# Patient Record
Sex: Female | Born: 1960
Health system: Southern US, Community
[De-identification: ages and names within clinical notes are randomized; demographics above are authoritative.]

## PROBLEM LIST (undated history)

## (undated) DIAGNOSIS — R079 Chest pain, unspecified: Secondary | ICD-10-CM

## (undated) DIAGNOSIS — M1990 Unspecified osteoarthritis, unspecified site: Secondary | ICD-10-CM

## (undated) DIAGNOSIS — I10 Essential (primary) hypertension: Secondary | ICD-10-CM

## (undated) DIAGNOSIS — E039 Hypothyroidism, unspecified: Secondary | ICD-10-CM

## (undated) DIAGNOSIS — R3915 Urgency of urination: Secondary | ICD-10-CM

## (undated) DIAGNOSIS — E119 Type 2 diabetes mellitus without complications: Secondary | ICD-10-CM

## (undated) DIAGNOSIS — E042 Nontoxic multinodular goiter: Secondary | ICD-10-CM

## (undated) DIAGNOSIS — D649 Anemia, unspecified: Secondary | ICD-10-CM

## (undated) DIAGNOSIS — K219 Gastro-esophageal reflux disease without esophagitis: Secondary | ICD-10-CM

## (undated) HISTORY — DX: Type 2 diabetes mellitus without complications: E11.9

## (undated) HISTORY — DX: Nontoxic multinodular goiter: E04.2

## (undated) HISTORY — PX: CHOLECYSTECTOMY: SHX55

## (undated) HISTORY — DX: Chest pain, unspecified: R07.9

## (undated) HISTORY — DX: Anemia, unspecified: D64.9

## (undated) HISTORY — PX: TUBAL LIGATION: SHX77

## (undated) HISTORY — PX: CARDIAC CATHETERIZATION: SHX172

## (undated) HISTORY — PX: KNEE SURGERY: SHX244

## (undated) HISTORY — DX: Gastro-esophageal reflux disease without esophagitis: K21.9

## (undated) HISTORY — DX: Unspecified osteoarthritis, unspecified site: M19.90

## (undated) HISTORY — DX: Essential (primary) hypertension: I10

---

## 1992-06-13 HISTORY — PX: TUBAL LIGATION: SHX77

## 1998-10-28 ENCOUNTER — Encounter: Payer: Self-pay | Admitting: Family Medicine

## 1998-10-28 ENCOUNTER — Ambulatory Visit (HOSPITAL_COMMUNITY): Admission: RE | Admit: 1998-10-28 | Discharge: 1998-10-28 | Payer: Self-pay | Admitting: Family Medicine

## 1998-10-30 ENCOUNTER — Encounter (HOSPITAL_BASED_OUTPATIENT_CLINIC_OR_DEPARTMENT_OTHER): Payer: Self-pay | Admitting: General Surgery

## 1998-11-02 ENCOUNTER — Ambulatory Visit (HOSPITAL_COMMUNITY): Admission: RE | Admit: 1998-11-02 | Discharge: 1998-11-03 | Payer: Self-pay | Admitting: General Surgery

## 1998-11-02 ENCOUNTER — Encounter (HOSPITAL_BASED_OUTPATIENT_CLINIC_OR_DEPARTMENT_OTHER): Payer: Self-pay | Admitting: General Surgery

## 1999-07-29 ENCOUNTER — Encounter: Payer: Self-pay | Admitting: Family Medicine

## 1999-07-29 ENCOUNTER — Ambulatory Visit (HOSPITAL_COMMUNITY): Admission: RE | Admit: 1999-07-29 | Discharge: 1999-07-29 | Payer: Self-pay | Admitting: Family Medicine

## 1999-08-11 ENCOUNTER — Other Ambulatory Visit: Admission: RE | Admit: 1999-08-11 | Discharge: 1999-08-11 | Payer: Self-pay | Admitting: Obstetrics and Gynecology

## 2000-08-11 ENCOUNTER — Other Ambulatory Visit: Admission: RE | Admit: 2000-08-11 | Discharge: 2000-08-11 | Payer: Self-pay | Admitting: Obstetrics and Gynecology

## 2000-12-05 ENCOUNTER — Encounter: Payer: Self-pay | Admitting: *Deleted

## 2000-12-05 ENCOUNTER — Emergency Department (HOSPITAL_COMMUNITY): Admission: EM | Admit: 2000-12-05 | Discharge: 2000-12-05 | Payer: Self-pay | Admitting: *Deleted

## 2001-01-26 ENCOUNTER — Encounter: Payer: Self-pay | Admitting: Endocrinology

## 2001-01-26 ENCOUNTER — Ambulatory Visit (HOSPITAL_COMMUNITY): Admission: RE | Admit: 2001-01-26 | Discharge: 2001-01-26 | Payer: Self-pay | Admitting: Endocrinology

## 2001-07-06 ENCOUNTER — Encounter: Payer: Self-pay | Admitting: Endocrinology

## 2001-07-06 ENCOUNTER — Ambulatory Visit (HOSPITAL_COMMUNITY): Admission: RE | Admit: 2001-07-06 | Discharge: 2001-07-06 | Payer: Self-pay | Admitting: Endocrinology

## 2001-08-29 ENCOUNTER — Other Ambulatory Visit: Admission: RE | Admit: 2001-08-29 | Discharge: 2001-08-29 | Payer: Self-pay | Admitting: Obstetrics and Gynecology

## 2002-09-26 ENCOUNTER — Other Ambulatory Visit: Admission: RE | Admit: 2002-09-26 | Discharge: 2002-09-26 | Payer: Self-pay | Admitting: Obstetrics and Gynecology

## 2003-04-02 ENCOUNTER — Ambulatory Visit (HOSPITAL_COMMUNITY): Admission: RE | Admit: 2003-04-02 | Discharge: 2003-04-02 | Payer: Self-pay | Admitting: Endocrinology

## 2003-04-02 ENCOUNTER — Encounter: Payer: Self-pay | Admitting: Endocrinology

## 2003-09-30 ENCOUNTER — Other Ambulatory Visit: Admission: RE | Admit: 2003-09-30 | Discharge: 2003-09-30 | Payer: Self-pay | Admitting: Obstetrics and Gynecology

## 2004-10-01 ENCOUNTER — Other Ambulatory Visit: Admission: RE | Admit: 2004-10-01 | Discharge: 2004-10-01 | Payer: Self-pay | Admitting: Obstetrics and Gynecology

## 2005-08-10 ENCOUNTER — Ambulatory Visit (HOSPITAL_COMMUNITY): Admission: RE | Admit: 2005-08-10 | Discharge: 2005-08-10 | Payer: Self-pay | Admitting: Endocrinology

## 2005-11-19 ENCOUNTER — Emergency Department (HOSPITAL_COMMUNITY): Admission: EM | Admit: 2005-11-19 | Discharge: 2005-11-19 | Payer: Self-pay | Admitting: Family Medicine

## 2006-06-13 HISTORY — PX: CARDIAC CATHETERIZATION: SHX172

## 2006-08-14 ENCOUNTER — Ambulatory Visit (HOSPITAL_COMMUNITY): Admission: RE | Admit: 2006-08-14 | Discharge: 2006-08-14 | Payer: Self-pay | Admitting: Family Medicine

## 2006-09-13 ENCOUNTER — Encounter: Admission: RE | Admit: 2006-09-13 | Discharge: 2006-12-12 | Payer: Self-pay | Admitting: Family Medicine

## 2007-05-22 ENCOUNTER — Observation Stay (HOSPITAL_COMMUNITY): Admission: EM | Admit: 2007-05-22 | Discharge: 2007-05-24 | Payer: Self-pay | Admitting: Emergency Medicine

## 2007-05-23 ENCOUNTER — Encounter (INDEPENDENT_AMBULATORY_CARE_PROVIDER_SITE_OTHER): Payer: Self-pay | Admitting: *Deleted

## 2007-08-14 ENCOUNTER — Ambulatory Visit (HOSPITAL_COMMUNITY): Admission: RE | Admit: 2007-08-14 | Discharge: 2007-08-14 | Payer: Self-pay | Admitting: Endocrinology

## 2008-04-23 ENCOUNTER — Encounter: Admission: RE | Admit: 2008-04-23 | Discharge: 2008-04-23 | Payer: Self-pay | Admitting: Family Medicine

## 2008-08-18 ENCOUNTER — Ambulatory Visit (HOSPITAL_COMMUNITY): Admission: RE | Admit: 2008-08-18 | Discharge: 2008-08-18 | Payer: Self-pay | Admitting: Endocrinology

## 2008-11-14 IMAGING — CT CT ANGIO CHEST
2 of 5 series · 17 of 36 positions shown · IV contrast (APPLIED)
Comparison: none

CLINICAL DATA: Chest pain, question pulmonary embolus. 
 CT ANGIOGRAPHY OF CHEST:
TECHNIQUE: Multidetector CT imaging of the chest was performed during bolus injection of intravenous contrast.  Multiplanar CT angiographic image reconstructions were generated to evaluate the vascular anatomy.
 Contrast:  100 cc Omnipaque 300.

[Series 5: pulm embolism 2.0 b31f st · axial · 0.61mm/px · z∈[-132,+72]mm · 14 of 118 slices shown]
[im 8/118  lung]
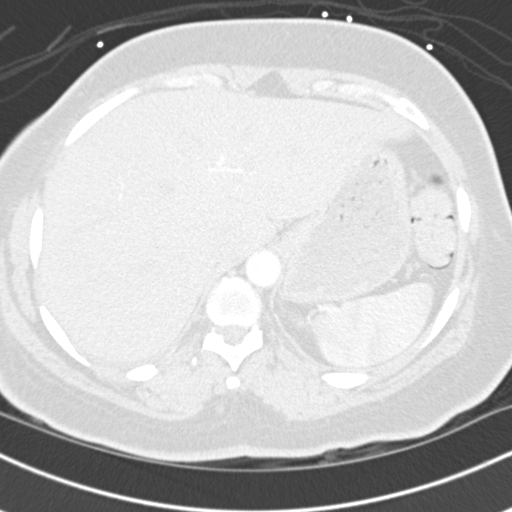
[im 16/118  mediastinal]
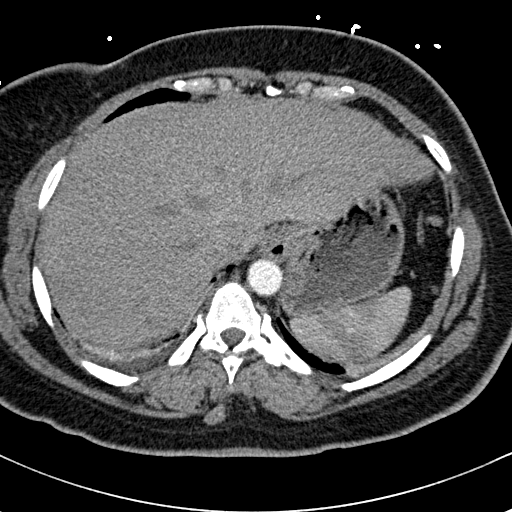
[im 24/118  lung]
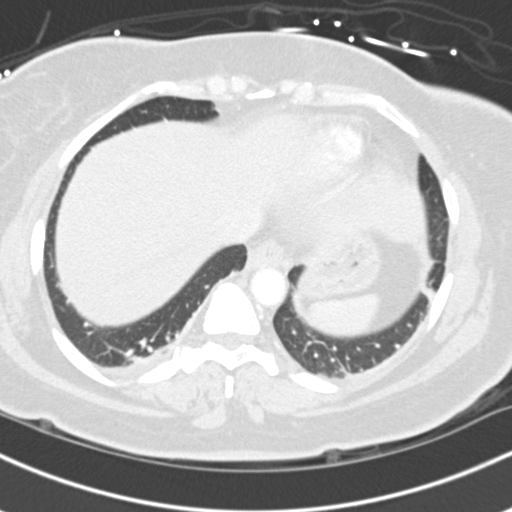
[im 32/118  mediastinal]
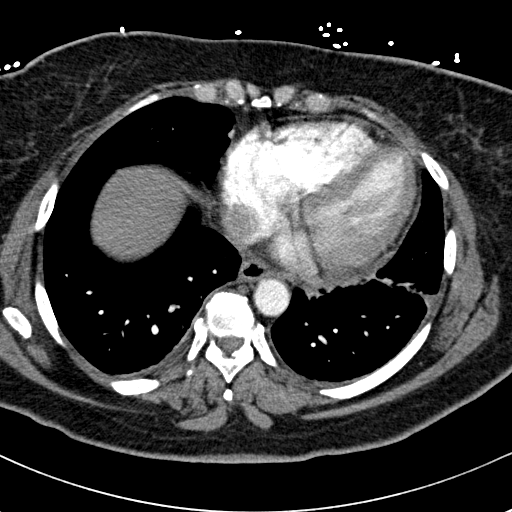
[im 40/118  lung]
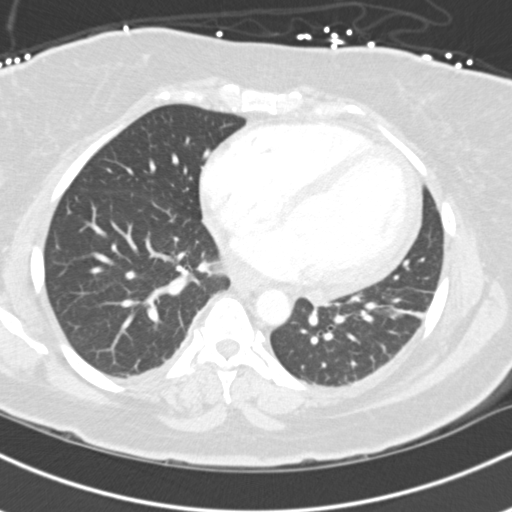
[im 47/118  mediastinal]
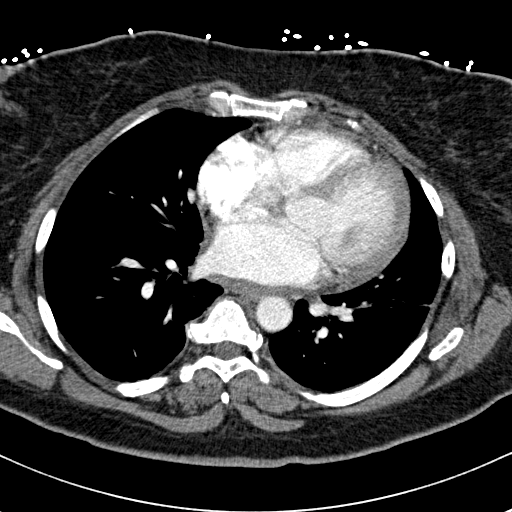
[im 55/118  lung]
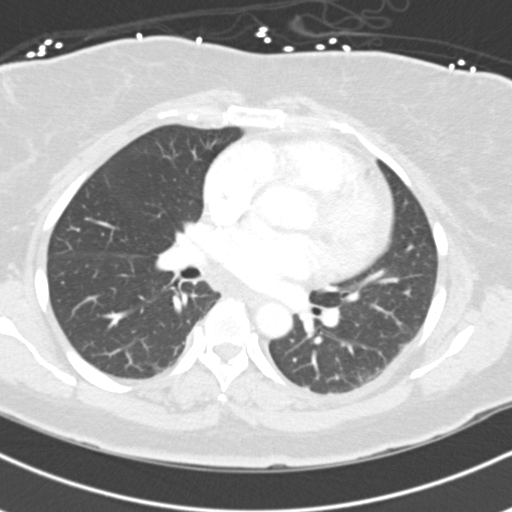
[im 63/118  mediastinal]
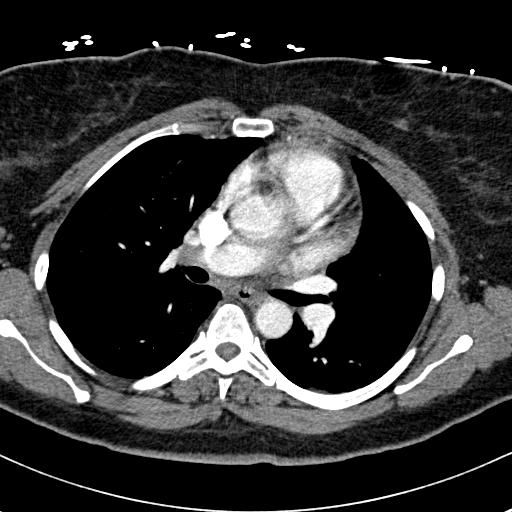
[im 71/118  lung]
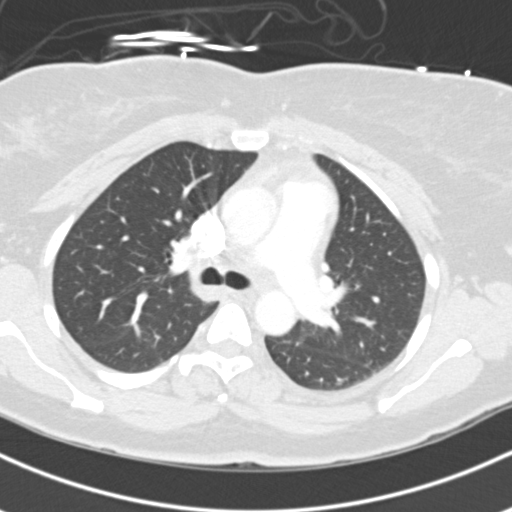
[im 79/118  mediastinal]
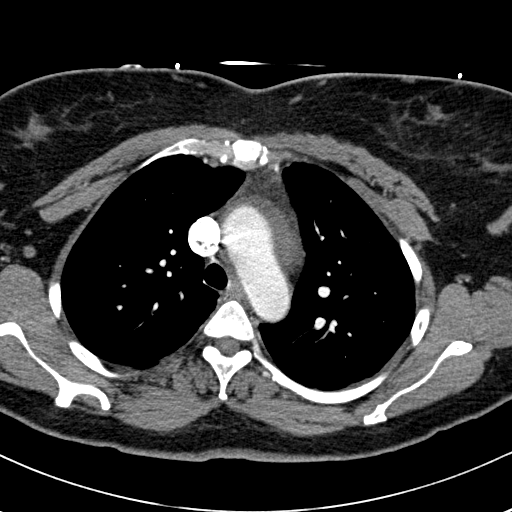
[im 86/118  lung]
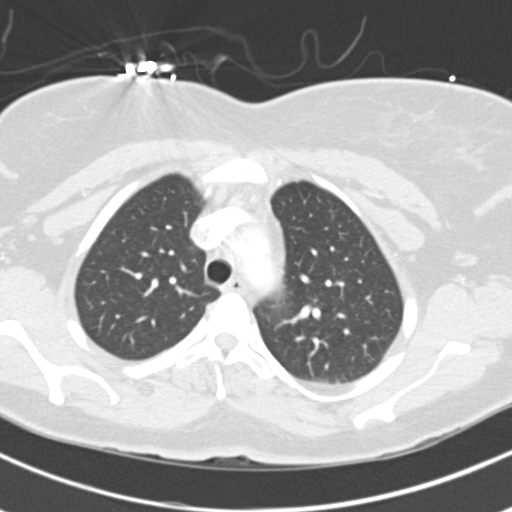
[im 94/118  mediastinal]
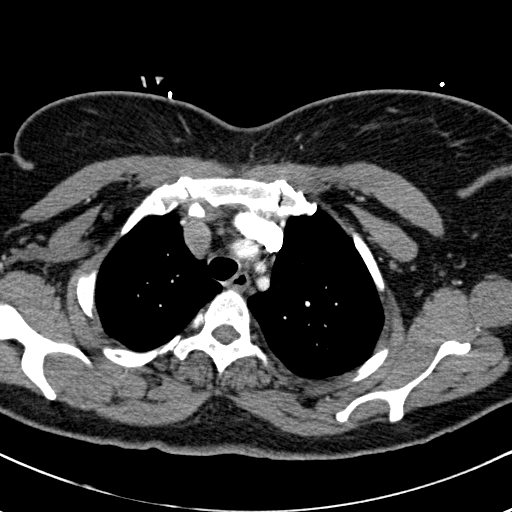
[im 102/118  lung]
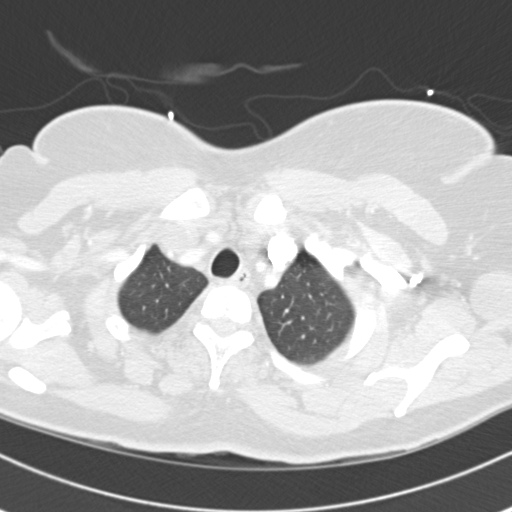
[im 110/118  mediastinal]
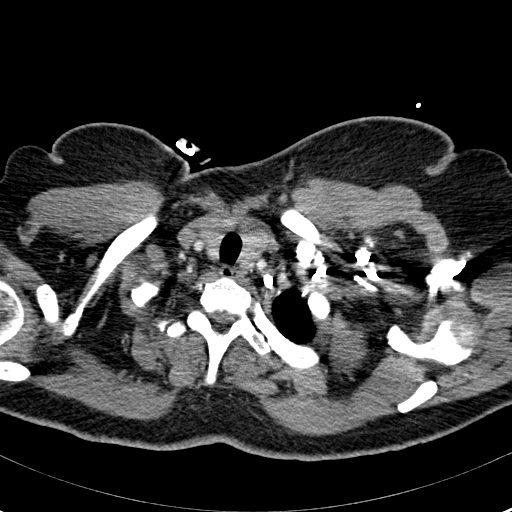

[Series 9: pulm embolism 2.0 spo cor st · coronal · 0.61mm/px · 3 of 116 slices shown]
[im 24/116  mediastinal]
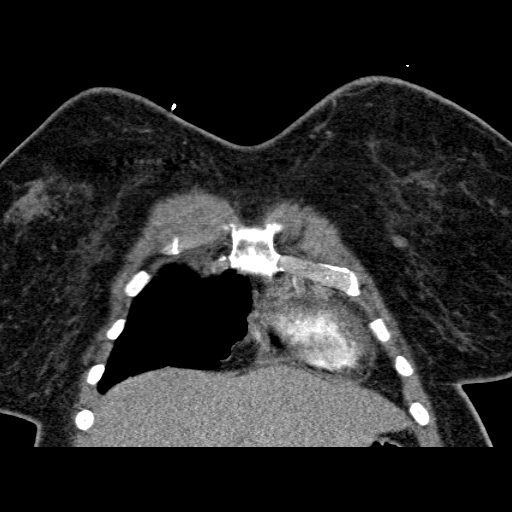
[im 47/116  mediastinal]
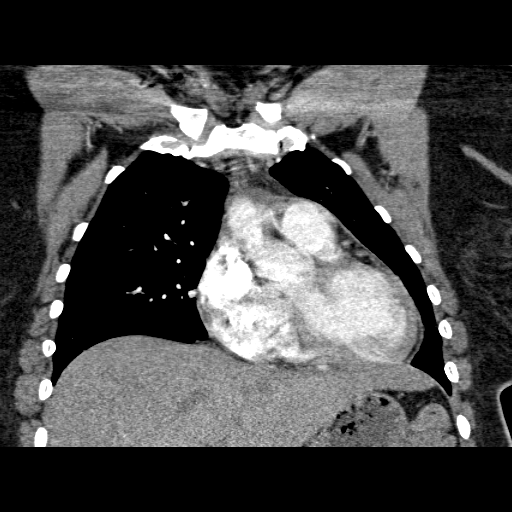
[im 70/116  mediastinal]
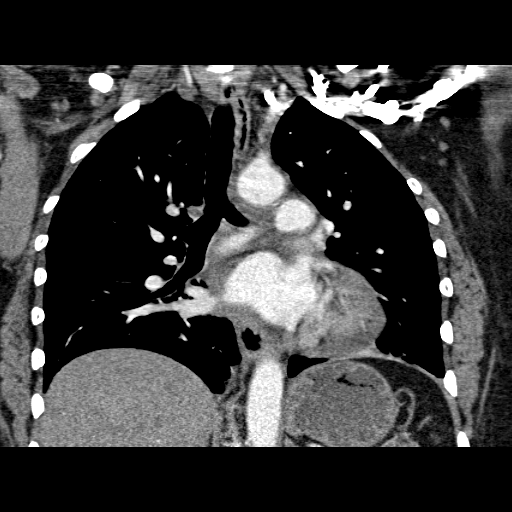

[17 of 36 positions shown; findings below may reference images not displayed]

FINDINGS: There is no CT scan evidence for pulmonary emboli.  Lung window settings demonstrate linear atelectatic changes within the lower lobes.  There is no mediastinal or hilar adenopathy.  The central airways are patent.  There are no pulmonary nodules.  
 Bone window settings demonstrate no osseous lesions.
IMPRESSION: 1.  No CT scan evidence for pulmonary emboli.  
 2.  Linear bibasilar atelectatic changes.

## 2009-06-30 ENCOUNTER — Ambulatory Visit: Payer: Self-pay | Admitting: Hematology and Oncology

## 2009-07-01 LAB — URINALYSIS, MICROSCOPIC - CHCC
Bilirubin (Urine): NEGATIVE
Glucose: NEGATIVE g/dL
Nitrite: NEGATIVE

## 2009-07-01 LAB — CBC & DIFF AND RETIC
Basophils Absolute: 0 10*3/uL (ref 0.0–0.1)
Eosinophils Absolute: 0.1 10*3/uL (ref 0.0–0.5)
HCT: 37.5 % (ref 34.8–46.6)
HGB: 12.2 g/dL (ref 11.6–15.9)
LYMPH%: 24.4 % (ref 14.0–49.7)
MONO#: 0.4 10*3/uL (ref 0.1–0.9)
NEUT#: 3.7 10*3/uL (ref 1.5–6.5)
NEUT%: 65.8 % (ref 38.4–76.8)
Platelets: 355 10*3/uL (ref 145–400)
RBC: 4.73 10*6/uL (ref 3.70–5.45)
Retic %: 0.96 % (ref 0.50–1.50)
WBC: 5.6 10*3/uL (ref 3.9–10.3)

## 2009-07-03 LAB — VON WILLEBRAND PANEL
Ristocetin-Cofactor: 115 % (ref 50–150)
Von Willebrand Ag: 166 % normal — ABNORMAL HIGH (ref 61–164)

## 2009-07-03 LAB — PROTEIN ELECTROPHORESIS, SERUM, WITH REFLEX
Albumin ELP: 52 % — ABNORMAL LOW (ref 55.8–66.1)
Alpha-2-Globulin: 11.3 % (ref 7.1–11.8)
Beta Globulin: 6.6 % (ref 4.7–7.2)
Total Protein, Serum Electrophoresis: 7.8 g/dL (ref 6.0–8.3)

## 2009-07-03 LAB — COMPREHENSIVE METABOLIC PANEL
Albumin: 4.5 g/dL (ref 3.5–5.2)
Alkaline Phosphatase: 69 U/L (ref 39–117)
Chloride: 100 mEq/L (ref 96–112)
Glucose, Bld: 104 mg/dL — ABNORMAL HIGH (ref 70–99)
Potassium: 3.7 mEq/L (ref 3.5–5.3)
Sodium: 138 mEq/L (ref 135–145)
Total Protein: 7.8 g/dL (ref 6.0–8.3)

## 2009-07-03 LAB — FERRITIN: Ferritin: 31 ng/mL (ref 10–291)

## 2009-07-03 LAB — HEMOGLOBINOPATHY EVALUATION
Hemoglobin Other: 0 % (ref 0.0–0.0)
Hgb A2 Quant: 2.6 % (ref 2.2–3.2)
Hgb A: 97.4 % (ref 96.8–97.8)

## 2009-07-03 LAB — FOLATE: Folate: 18.8 ng/mL

## 2009-07-03 LAB — IRON AND TIBC: Iron: 53 ug/dL (ref 42–145)

## 2009-07-06 LAB — FECAL OCCULT BLOOD, GUAIAC: Occult Blood: NEGATIVE

## 2009-10-22 ENCOUNTER — Ambulatory Visit (HOSPITAL_COMMUNITY): Admission: RE | Admit: 2009-10-22 | Discharge: 2009-10-22 | Payer: Self-pay | Admitting: Endocrinology

## 2010-07-02 ENCOUNTER — Other Ambulatory Visit (HOSPITAL_COMMUNITY): Payer: Self-pay | Admitting: Endocrinology

## 2010-07-02 DIAGNOSIS — E042 Nontoxic multinodular goiter: Secondary | ICD-10-CM

## 2010-10-25 ENCOUNTER — Ambulatory Visit
Admission: RE | Admit: 2010-10-25 | Discharge: 2010-10-25 | Disposition: A | Discharge status: Home / Self Care | Payer: 59 | Source: Ambulatory Visit | Attending: Endocrinology | Admitting: Endocrinology

## 2010-10-25 DIAGNOSIS — E042 Nontoxic multinodular goiter: Secondary | ICD-10-CM

## 2010-10-26 NOTE — H&P (Signed)
NAMERENELDA, Meghan Morales              ACCOUNT NO.:  [id number]   MEDICAL RECORD NO.:  [id number]          PATIENT TYPE:  INP   LOCATION:  2807                         FACILITY:  [facility]   PHYSICIAN:  Darlin Priestly, MD  DATE OF BIRTH:  [birth year]   DATE OF ADMISSION:  05/22/2007  DATE OF DISCHARGE:                              HISTORY & PHYSICAL   Ms. Meghan Morales is a 50 year old married African-American female patient who  comes to the emergency room by EMS and diagnosed as an ST elevation MI  with possible inferior ST elevation inferiorly.  She states that  yesterday between 2-3 p.m., she started having chest pain with shortness  of breath.  No nausea or vomiting.  She was hot and diaphoretic.  Today,  she went to see her primary care physician, Dr. Renaye Rakers.  An EKG was  done, and she was sent to the ER by EMS.   PAST MEDICAL HISTORY:  1. Hypertension x17 years.  2. NIDDM x2 years.  3. Hypothyroidism.  4. She has had a cholecystectomy and a tubal ligation.   MEDICATIONS:  Metformin, Synthroid, Micardis, aspirin 81 mg daily.  Other doses of medicines not known.   ALLERGIES:  NKDA.   FAMILY HISTORY:  Mother is still alive.  She has hypertension.  Father  is alive.  He has some CHF.  She has one brother.  Both grandparents are  without any coronary artery disease.   SOCIAL HISTORY:  She does not smoke.  She does not drink any alcohol.  She is married and has two children.  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  It is a stressful work.  She lives in  [place].   REVIEW OF SYSTEMS:  She denies any syncope, presyncope.  No GERD.  No  black, tarry stools.  Positive chest pain.  No abdominal pain.  No lower  extremity edema.  No cough, no cold, no fever, no chills.  No dysuria.  No recent falls, trauma, or surgeries.  Other systems are negative.   PHYSICAL EXAMINATION:  Blood pressure is 137/83, heart rate 84.  GENERAL:  She is having mild pain.  She is tearful and  talking on the  phone when we entered the room.  HEENT:  Pupils are equal and round.  She has no carotid bruits, no thyromegaly, no adenopathy.  Respirations are clear to auscultation bilaterally.  CARDIOVASCULAR:  Heart sounds are regular.  S1 and S2 are present.  No  murmur or gallop is noted.  GI:  Bowel sounds are present x4.  Positive obesity.  No tenderness.  NEURO:  Alert and oriented x3.  She has no focal abnormality.  MUSCULOSKELETAL:  Moves all extremities x4.  LOWER EXTREMITIES:  No edema.   ASSESSMENT:  1. Chest pain.  2. Abnormal electrocardiogram with possible inferior ST elevation and      possible ST elevation myocardial infarction.  3. Hypertension.  4. Non-insulin-dependent diabetes mellitus.  5. Hypothyroidism.   PLAN:  She was seen by Dr. Lenise Herald in the ER.  Risks and benefits  of emergent cath  described and PCI all explained, including death, MI,  CVA, or need for urgent open heart surgery, and possible chronic  complications.  The patient was taken to the cath lab.      Lezlie Octave, N.P.      Darlin Priestly, MD  Electronically Signed    BB/MEDQ  D:  05/22/2007  T:  05/22/2007  Job:  [id number]   cc:   Renaye Rakers, M.D.

## 2010-10-26 NOTE — Cardiovascular Report (Signed)
Meghan Morales, Meghan Morales              ACCOUNT NO.:  [id number]   MEDICAL RECORD NO.:  [id number]          PATIENT TYPE:  INP   LOCATION:  2807                         FACILITY:  [facility]   PHYSICIAN:  Darlin Priestly, MD  DATE OF BIRTH:  05/09/1961   DATE OF PROCEDURE:  05/22/2007  DATE OF DISCHARGE:                            CARDIAC CATHETERIZATION   PROCEDURE:  1. Left heart catheterization.  2. Coronary angiography.  3. Left ventriculogram.  4. Abdominal aortogram.   ATTENDING:  Darlin Priestly, MD   COMPLICATIONS:  None.   INDICATIONS:  [name] is a 50 year old female patient of Dr. Renaye Rakers and Dr. Caprice Kluver, with a history of hypertension and diabetes,  was seen by Dr. Renaye Rakers with a complaint of 24 hours of substernal  chest pain without associated radiation.  In Dr. Tedra Senegal office, she was  noted have mild inferolateral ST elevation.  She was subsequently  transferred to [facility], where a code STEMI was activated.  She did appear to have mild inferolateral ST elevation.  She is now  brought for cardiac catheterization to rule out significant CAD.   DESCRIPTION OF OPERATION:  After giving informed written consent, the  patient was brought to the cardiac cath lab.  Right groin was shaved,  prepped and draped in sterile fashion.  EKG monitoring was established.  Using a modified Seldinger technique, a #6-French arterial sheath was  inserted in the right femoral artery.  The 6-French diagnostic catheter  was used to perform diagnostic angiography.   The left main is a medium to large vessel with no significant disease.   The LAD is a large vessel coursing to the apex and gives rise to 2  diagonal branches.  The LAD has no significant disease.   The first and second diagonals are small- to medium-size vessels with no  disease.   The left circumflex is a medium-size vessel coursing to the AV groove  and gives rise to 2 obtuse marginal branches.   The AV groove circumflex  has no significant disease.   The first OM is a medium-sized vessel which bifurcates distally with no  significant disease.   The second OM is a small vessel with no significant disease.   The right coronary is a medium-sized vessel which is dominant and give  off both PDA as well as a posterolateral branch.  There is no  significant disease in the RCA, PDA or posterolateral branch.   Left ventriculogram revealed a preserved EF of 60%.  There is a  significant amount of ectopy noted with MR, however, suggests this is  all secondary to ectopy.   Abdominal aortogram reveals no evidence of renal artery stenosis or  aneurysm.   CONCLUSION:  1. No significant coronary artery disease.  2. Normal left ventricular systolic function.  3. No evidence of renal artery stenosis.      Darlin Priestly, MD  Electronically Signed     RHM/MEDQ  D:  05/22/2007  T:  05/23/2007  Job:  [id number]   cc:   Thereasa Solo. Little, M.D.  Adrian Saran  Parke Simmers, M.D.

## 2010-10-26 NOTE — Discharge Summary (Signed)
Meghan Morales, Meghan Morales              ACCOUNT NO.:  [id number]   MEDICAL RECORD NO.:  [id number]          PATIENT TYPE:  INP   LOCATION:  2006                         FACILITY:  [facility]   PHYSICIAN:  Raymon Mutton, P.A. DATE OF BIRTH:  Oct 11, 1960   DATE OF ADMISSION:  05/22/2007  DATE OF DISCHARGE:  05/24/2007                               DISCHARGE SUMMARY   DISCHARGE DIAGNOSES:  1. Chest pain resolved, etiology undetermined, status post cath with      normal coronaries.  2. Hypertension.  3. Hypothyroidism,  4. Non-insulin-dependent diabetes mellitus.   HISTORY:  This is 50 year old female who presented to the hospital with  complaints of chest pain and to the ER was diagnosed with ST elevation  MI with inferior ST elevations with shortness of breath, feeling hot and  diaphoretic. Immediately the same day the patient underwent coronary  angiography that revealed no significant coronary artery disease, normal  left ventricular systolic function and her renal arteries did not reveal  any signs of stenosis.   The patient tolerated the cath and was transferred to the unit in stable  condition.  We cycled her cardiac enzymes that did not show any  abnormality. Her lipid profile was normal too, and she was mildly  anemic. And her D-dimer was elevated to 2.  A chest CT angio was ordered  and this did not reveal any signs of pulmonary embolism.   The next morning the patient was seen by Dr. Allyson Sabal and was considered to  be stable for discharge home.  Her hospital laboratories showed  hemoglobin of  10.8, hematocrit 31.5, white blood cell count 6.5,  platelet count 297. Sodium 139, potassium 3.4, BUN 8, creatinine 0.74,  chloride 107, CO2 of 25, glucose 89, iron was low at 31, iron-binding  capacity normal, and UIBC 225.  Ferritin is 111. Normal TSH 0.695.  Vitamin B12 of 533, magnesium 1.9. Total cholesterol 110, triglycerides  36, HDL 33, LDL 70.   DISCHARGE MEDICATIONS:  Aspirin 81  mg daily, Protonix 40 mg daily,  Micardis __________ 25 mg daily, metformin 500 mg, the patient was  allowed to resume on Saturday. Synthroid 150 mcg daily.   DISCHARGE FOLLOWUP:  Dr. Jenne Campus will see patient on June 17, 2006 at  9:45 a.m. in our office.      Raymon Mutton, P.A.     MK/MEDQ  D:  05/24/2007  T:  05/24/2007  Job:  [id number]   cc:   Darlin Priestly, MD  Renaye Rakers, M.D.

## 2010-10-27 ENCOUNTER — Other Ambulatory Visit (HOSPITAL_COMMUNITY): Payer: Self-pay

## 2010-12-03 ENCOUNTER — Other Ambulatory Visit: Payer: Self-pay | Admitting: Obstetrics and Gynecology

## 2011-03-21 LAB — I-STAT EC8
Acid-Base Excess: 1
Acid-base deficit: 3 — ABNORMAL HIGH
BUN: 11
Chloride: 103
Chloride: 108
Glucose, Bld: 106 — ABNORMAL HIGH
HCT: 34 — ABNORMAL LOW
Operator id: 274862
Potassium: 3.2 — ABNORMAL LOW
Sodium: 138
pCO2 arterial: 30.9 — ABNORMAL LOW
pH, Arterial: 7.426 — ABNORMAL HIGH

## 2011-03-21 LAB — CARDIAC PANEL(CRET KIN+CKTOT+MB+TROPI)
CK, MB: 0.9
Relative Index: INVALID
Total CK: 88

## 2011-03-21 LAB — CBC
HCT: 31.5 — ABNORMAL LOW
Hemoglobin: 10.1 — ABNORMAL LOW
Hemoglobin: 9.6 — ABNORMAL LOW
MCHC: 34.3
MCV: 78.3
Platelets: 297
RBC: 3.64 — ABNORMAL LOW
RBC: 3.82 — ABNORMAL LOW
RDW: 13.4
RDW: 13.5
WBC: 6.5

## 2011-03-21 LAB — LIPID PANEL
Cholesterol: 110
Cholesterol: 112
Total CHOL/HDL Ratio: 3.6

## 2011-03-21 LAB — BASIC METABOLIC PANEL
BUN: 8
CO2: 24
CO2: 25
Chloride: 107
Creatinine, Ser: 0.74
GFR calc non Af Amer: >60
Glucose, Bld: 108 — ABNORMAL HIGH
Glucose, Bld: 89
Potassium: 3 — ABNORMAL LOW
Potassium: 3.4 — ABNORMAL LOW
Sodium: 137

## 2011-03-21 LAB — DIFFERENTIAL
Basophils Relative: 1
Eosinophils Absolute: 0.1 — ABNORMAL LOW
Monocytes Relative: 10
Neutrophils Relative %: 70

## 2011-03-21 LAB — IRON AND TIBC: UIBC: 225

## 2011-03-21 LAB — MAGNESIUM
Magnesium: 1.9
Magnesium: 2.1

## 2011-03-21 LAB — VITAMIN B12: Vitamin B-12: 533 (ref 211–911)

## 2011-03-21 LAB — COMPREHENSIVE METABOLIC PANEL
ALT: 24
Alkaline Phosphatase: 52
CO2: 25
GFR calc non Af Amer: >60
Glucose, Bld: 117 — ABNORMAL HIGH
Potassium: 3.1 — ABNORMAL LOW
Sodium: 133 — ABNORMAL LOW

## 2011-03-21 LAB — HEMOGLOBIN A1C
Hgb A1c MFr Bld: 5.7
Mean Plasma Glucose: 126

## 2011-03-21 LAB — PROTIME-INR
INR: 1
INR: 1.2
Prothrombin Time: 15

## 2011-03-21 LAB — RETICULOCYTES: Retic Count, Absolute: 31.8

## 2011-03-21 LAB — POCT I-STAT CREATININE: Creatinine, Ser: 0.5

## 2011-03-21 LAB — FOLATE RBC: RBC Folate: 676 — ABNORMAL HIGH

## 2011-03-21 LAB — APTT: aPTT: 25

## 2012-08-06 ENCOUNTER — Other Ambulatory Visit: Payer: Self-pay | Admitting: Endocrinology

## 2012-08-06 DIAGNOSIS — E049 Nontoxic goiter, unspecified: Secondary | ICD-10-CM

## 2012-10-22 ENCOUNTER — Other Ambulatory Visit: Payer: 59

## 2012-10-26 ENCOUNTER — Ambulatory Visit
Admission: RE | Admit: 2012-10-26 | Discharge: 2012-10-26 | Disposition: A | Discharge status: Home / Self Care | Payer: 59 | Source: Ambulatory Visit | Attending: Endocrinology | Admitting: Endocrinology

## 2012-10-26 DIAGNOSIS — E049 Nontoxic goiter, unspecified: Secondary | ICD-10-CM

## 2013-01-03 ENCOUNTER — Other Ambulatory Visit: Payer: Self-pay | Admitting: Obstetrics and Gynecology

## 2013-05-08 ENCOUNTER — Ambulatory Visit (INDEPENDENT_AMBULATORY_CARE_PROVIDER_SITE_OTHER): Payer: 59

## 2013-05-08 ENCOUNTER — Encounter: Payer: Self-pay | Admitting: Podiatry

## 2013-05-08 ENCOUNTER — Ambulatory Visit (INDEPENDENT_AMBULATORY_CARE_PROVIDER_SITE_OTHER): Payer: 59 | Admitting: Podiatry

## 2013-05-08 VITALS — BP 111/66 | HR 74 | Resp 12

## 2013-05-08 DIAGNOSIS — R52 Pain, unspecified: Secondary | ICD-10-CM

## 2013-05-08 DIAGNOSIS — M722 Plantar fascial fibromatosis: Secondary | ICD-10-CM

## 2013-05-08 MED ORDER — TRIAMCINOLONE ACETONIDE 10 MG/ML IJ SUSP
10.0000 mg | Freq: Once | INTRAMUSCULAR | Status: AC
  Administered 2013-05-08: 10 mg

## 2013-05-08 MED ORDER — DICLOFENAC SODIUM 75 MG PO TBEC: 75.0000 mg | DELAYED_RELEASE_TABLET | Freq: Two times a day (BID) | ORAL | Status: DC

## 2013-05-08 NOTE — Progress Notes (Signed)
N-SORE L-LT HEEL D-1 MONTH O-SLOWLY C-BETTER A-PRESSURE T-N /A

## 2013-05-08 NOTE — Progress Notes (Signed)
Subjective:     Patient ID: Meghan Morales, female   DOB: 10-Nov-1960, 52 y.o.   MRN: [id number]  HPI patient states I'm having pain in my left heel of several months duration. States she's had this in the past that it's been at least 5 years since her last episode and she is on her feet quite a bit at work. States her sugar has been under good control and she was sent over by Dr. Parke Simmers   Review of Systems  All other systems reviewed and are negative.       Objective:   Physical Exam  Nursing note and vitals reviewed. Constitutional: She is oriented to person, place, and time.  Cardiovascular: Intact distal pulses.   Musculoskeletal: Normal range of motion.  Neurological: She is oriented to person, place, and time.  Skin: Skin is warm.   neurovascular status is intact with mild equinus noted and flatfoot deformity noted of both feet. Pain in the left plantar fascia at the insertional point of the tendon into the calcaneus with mild to moderate fluid buildup noted    Assessment:     Plantar fasciitis left with some depression of the arch noted    Plan:     H&P and x-rays reviewed with patient. Today I injected the left plantar fascia 3 mg Kenalog 5 of Xylocaine Marcaine mixture and applied fascially brace with instructions on usage. Placed on Voltaren 75 mg twice a day and reappoint in 2 weeks

## 2013-05-08 NOTE — Patient Instructions (Signed)
Plantar Fasciitis (Heel Spur Syndrome)  with Rehab  The plantar fascia is a fibrous, ligament-like, soft-tissue structure that spans the bottom of the foot. Plantar fasciitis is a condition that causes pain in the foot due to inflammation of the tissue.  SYMPTOMS   · Pain and tenderness on the underneath side of the foot.  · Pain that worsens with standing or walking.  CAUSES   Plantar fasciitis is caused by irritation and injury to the plantar fascia on the underneath side of the foot. Common mechanisms of injury include:  · Direct trauma to bottom of the foot.  · Damage to a small nerve that runs under the foot where the main fascia attaches to the heel bone.  · Stress placed on the plantar fascia due to bone spurs.  RISK INCREASES WITH:   · Activities that place stress on the plantar fascia (running, jumping, pivoting, or cutting).  · Poor strength and flexibility.  · Improperly fitted shoes.  · Tight calf muscles.  · Flat feet.  · Failure to warm-up properly before activity.  · Obesity.  PREVENTION  · Warm up and stretch properly before activity.  · Allow for adequate recovery between workouts.  · Maintain physical fitness:  · Strength, flexibility, and endurance.  · Cardiovascular fitness.  · Maintain a health body weight.  · Avoid stress on the plantar fascia.  · Wear properly fitted shoes, including arch supports for individuals who have flat feet.  PROGNOSIS   If treated properly, then the symptoms of plantar fasciitis usually resolve without surgery. However, occasionally surgery is necessary.  RELATED COMPLICATIONS   · Recurrent symptoms that may result in a chronic condition.  · Problems of the lower back that are caused by compensating for the injury, such as limping.  · Pain or weakness of the foot during push-off following surgery.  · Chronic inflammation, scarring, and partial or complete fascia tear, occurring more often from repeated injections.  TREATMENT   Treatment initially involves the use of  ice and medication to help reduce pain and inflammation. The use of strengthening and stretching exercises may help reduce pain with activity, especially stretches of the Achilles tendon. These exercises may be performed at home or with a therapist. Your caregiver may recommend that you use heel cups of arch supports to help reduce stress on the plantar fascia. Occasionally, corticosteroid injections are given to reduce inflammation. If symptoms persist for greater than 6 months despite non-surgical (conservative), then surgery may be recommended.   MEDICATION   · If pain medication is necessary, then nonsteroidal anti-inflammatory medications, such as aspirin and ibuprofen, or other minor pain relievers, such as acetaminophen, are often recommended.  · Do not take pain medication within 7 days before surgery.  · Prescription pain relievers may be given if deemed necessary by your caregiver. Use only as directed and only as much as you need.  · Corticosteroid injections may be given by your caregiver. These injections should be reserved for the most serious cases, because they may only be given a certain number of times.  HEAT AND COLD  · Cold treatment (icing) relieves pain and reduces inflammation. Cold treatment should be applied for 10 to 15 minutes every 2 to 3 hours for inflammation and pain and immediately after any activity that aggravates your symptoms. Use ice packs or massage the area with a piece of ice (ice massage).  · Heat treatment may be used prior to performing the stretching and strengthening activities prescribed   by your caregiver, physical therapist, or athletic trainer. Use a heat pack or soak the injury in warm water.  SEEK IMMEDIATE MEDICAL CARE IF:  · Treatment seems to offer no benefit, or the condition worsens.  · Any medications produce adverse side effects.  EXERCISES  RANGE OF MOTION (ROM) AND STRETCHING EXERCISES - Plantar Fasciitis (Heel Spur Syndrome)  These exercises may help you  when beginning to rehabilitate your injury. Your symptoms may resolve with or without further involvement from your physician, physical therapist or athletic trainer. While completing these exercises, remember:   · Restoring tissue flexibility helps normal motion to return to the joints. This allows healthier, less painful movement and activity.  · An effective stretch should be held for at least 30 seconds.  · A stretch should never be painful. You should only feel a gentle lengthening or release in the stretched tissue.  RANGE OF MOTION - Toe Extension, Flexion  · Sit with your right / left leg crossed over your opposite knee.  · Grasp your toes and gently pull them back toward the top of your foot. You should feel a stretch on the bottom of your toes and/or foot.  · Hold this stretch for __________ seconds.  · Now, gently pull your toes toward the bottom of your foot. You should feel a stretch on the top of your toes and or foot.  · Hold this stretch for __________ seconds.  Repeat __________ times. Complete this stretch __________ times per day.   RANGE OF MOTION - Ankle Dorsiflexion, Active Assisted  · Remove shoes and sit on a chair that is preferably not on a carpeted surface.  · Place right / left foot under knee. Extend your opposite leg for support.  · Keeping your heel down, slide your right / left foot back toward the chair until you feel a stretch at your ankle or calf. If you do not feel a stretch, slide your bottom forward to the edge of the chair, while still keeping your heel down.  · Hold this stretch for __________ seconds.  Repeat __________ times. Complete this stretch __________ times per day.   STRETCH  Gastroc, Standing  · Place hands on wall.  · Extend right / left leg, keeping the front knee somewhat bent.  · Slightly point your toes inward on your back foot.  · Keeping your right / left heel on the floor and your knee straight, shift your weight toward the wall, not allowing your back to  arch.  · You should feel a gentle stretch in the right / left calf. Hold this position for __________ seconds.  Repeat __________ times. Complete this stretch __________ times per day.  STRETCH  Soleus, Standing  · Place hands on wall.  · Extend right / left leg, keeping the other knee somewhat bent.  · Slightly point your toes inward on your back foot.  · Keep your right / left heel on the floor, bend your back knee, and slightly shift your weight over the back leg so that you feel a gentle stretch deep in your back calf.  · Hold this position for __________ seconds.  Repeat __________ times. Complete this stretch __________ times per day.  STRETCH  Gastrocsoleus, Standing   Note: This exercise can place a lot of stress on your foot and ankle. Please complete this exercise only if specifically instructed by your caregiver.   · Place the ball of your right / left foot on a step, keeping   your other foot firmly on the same step.  · Hold on to the wall or a rail for balance.  · Slowly lift your other foot, allowing your body weight to press your heel down over the edge of the step.  · You should feel a stretch in your right / left calf.  · Hold this position for __________ seconds.  · Repeat this exercise with a slight bend in your right / left knee.  Repeat __________ times. Complete this stretch __________ times per day.   STRENGTHENING EXERCISES - Plantar Fasciitis (Heel Spur Syndrome)   These exercises may help you when beginning to rehabilitate your injury. They may resolve your symptoms with or without further involvement from your physician, physical therapist or athletic trainer. While completing these exercises, remember:   · Muscles can gain both the endurance and the strength needed for everyday activities through controlled exercises.  · Complete these exercises as instructed by your physician, physical therapist or athletic trainer. Progress the resistance and repetitions only as guided.  STRENGTH - Towel  Curls  · Sit in a chair positioned on a non-carpeted surface.  · Place your foot on a towel, keeping your heel on the floor.  · Pull the towel toward your heel by only curling your toes. Keep your heel on the floor.  · If instructed by your physician, physical therapist or athletic trainer, add ____________________ at the end of the towel.  Repeat __________ times. Complete this exercise __________ times per day.  STRENGTH - Ankle Inversion  · Secure one end of a rubber exercise band/tubing to a fixed object (table, pole). Loop the other end around your foot just before your toes.  · Place your fists between your knees. This will focus your strengthening at your ankle.  · Slowly, pull your big toe up and in, making sure the band/tubing is positioned to resist the entire motion.  · Hold this position for __________ seconds.  · Have your muscles resist the band/tubing as it slowly pulls your foot back to the starting position.  Repeat __________ times. Complete this exercises __________ times per day.   Document Released: 05/30/2005 Document Revised: 08/22/2011 Document Reviewed: 09/11/2008  [employer]® Patient Information ©2014 [employer].

## 2013-05-08 NOTE — Progress Notes (Signed)
  Subjective:    Patient ID: Meghan Morales, female    DOB: 01-May-1961, 52 y.o.   MRN: [id number]  HPI    Review of Systems  Musculoskeletal: Positive for back pain and joint swelling.  Hematological: Bruises/bleeds easily.  All other systems reviewed and are negative.       Objective:   Physical Exam        Assessment & Plan:

## 2013-05-27 ENCOUNTER — Ambulatory Visit (INDEPENDENT_AMBULATORY_CARE_PROVIDER_SITE_OTHER): Payer: 59 | Admitting: Podiatry

## 2013-05-27 ENCOUNTER — Encounter: Payer: Self-pay | Admitting: Podiatry

## 2013-05-27 VITALS — BP 130/65 | HR 76 | Resp 12

## 2013-05-27 DIAGNOSIS — L259 Unspecified contact dermatitis, unspecified cause: Secondary | ICD-10-CM

## 2013-05-27 DIAGNOSIS — M722 Plantar fascial fibromatosis: Secondary | ICD-10-CM

## 2013-05-27 NOTE — Progress Notes (Signed)
Subjective:     Patient ID: Meghan Morales, female   DOB: 1960-08-30, 52 y.o.   MRN: [id number]  HPI patient's left foot is doing much better with significant diminishment of pain and she is also complaining now of itching on top of her right foot near the fourth and fifth toe   Review of Systems     Objective:   Physical Exam Neurovascular status intact no health history changes noted with significant diminishment of pain plantar heel and slight prominence on the right fourth and fifth toe    Assessment:     Plan her fasciitis improve left with mild dermatitis right or fungal infection     Plan:     Advised on physical therapy supportive shoes and ice therapy for the left heel and dispensed Naftin for the right

## 2013-11-18 ENCOUNTER — Encounter: Payer: Self-pay | Admitting: Podiatry

## 2013-11-18 ENCOUNTER — Ambulatory Visit (INDEPENDENT_AMBULATORY_CARE_PROVIDER_SITE_OTHER): Payer: 59 | Admitting: Podiatry

## 2013-11-18 VITALS — BP 127/73 | HR 82 | Resp 16

## 2013-11-18 DIAGNOSIS — M722 Plantar fascial fibromatosis: Secondary | ICD-10-CM

## 2013-11-18 MED ORDER — TRIAMCINOLONE ACETONIDE 10 MG/ML IJ SUSP
10.0000 mg | Freq: Once | INTRAMUSCULAR | Status: AC
  Administered 2013-11-18: 10 mg

## 2013-11-18 MED ORDER — DICLOFENAC SODIUM 75 MG PO TBEC: 75.0000 mg | DELAYED_RELEASE_TABLET | Freq: Two times a day (BID) | ORAL | Status: DC

## 2013-11-18 NOTE — Patient Instructions (Signed)

## 2013-11-19 NOTE — Progress Notes (Signed)
Subjective:     Patient ID: Meghan Morales, female   DOB: 1961-02-16, 53 y.o.   MRN: [id number]  HPI patient presents stating my left heel has started to really hurting again and I need to have it worked on and I need to be able to walk  Review of Systems     Objective:   Physical Exam Neurovascular status intact with no health history changes noted and discomfort plantar heel left at the insertional point of the tendon into the calcaneus with foot structural flatfoot deformity noted    Assessment:     Plantar fasciitis of the left heel which is quite intense in his nature for several months    Plan:     Reviewed condition and recommended stretching with splint of which patient states she has one at home from her husband and will start using. Reinjected the plantar fascia 3 mg Kenalog 5 mg Xylocaine Marcaine mixture and scanned for custom orthotics to reduce stress against the heel. Reappoint when orthotics returned

## 2013-12-09 ENCOUNTER — Ambulatory Visit (INDEPENDENT_AMBULATORY_CARE_PROVIDER_SITE_OTHER): Payer: 59 | Admitting: *Deleted

## 2013-12-09 DIAGNOSIS — M722 Plantar fascial fibromatosis: Secondary | ICD-10-CM

## 2013-12-09 NOTE — Progress Notes (Signed)
   Subjective:    Patient ID: Meghan Morales, female    DOB: 04/15/1961, 53 y.o.   MRN: [id number]  HPI Pick up orthotics and given instruction.   Review of Systems     Objective:   Physical Exam        Assessment & Plan:

## 2013-12-09 NOTE — Patient Instructions (Signed)

## 2013-12-24 ENCOUNTER — Other Ambulatory Visit: Payer: Self-pay | Admitting: Endocrinology

## 2013-12-24 DIAGNOSIS — E041 Nontoxic single thyroid nodule: Secondary | ICD-10-CM

## 2014-01-02 ENCOUNTER — Other Ambulatory Visit: Payer: 59

## 2014-01-06 ENCOUNTER — Ambulatory Visit
Admission: RE | Admit: 2014-01-06 | Discharge: 2014-01-06 | Disposition: A | Discharge status: Home / Self Care | Payer: 59 | Source: Ambulatory Visit | Attending: Endocrinology | Admitting: Endocrinology

## 2014-01-06 DIAGNOSIS — E041 Nontoxic single thyroid nodule: Secondary | ICD-10-CM

## 2014-01-23 ENCOUNTER — Other Ambulatory Visit: Payer: Self-pay | Admitting: Obstetrics and Gynecology

## 2014-01-24 LAB — CYTOLOGY - PAP

## 2014-04-08 ENCOUNTER — Encounter: Payer: Self-pay | Admitting: Dietician

## 2014-04-08 ENCOUNTER — Encounter: Payer: 59 | Attending: Family Medicine | Admitting: Dietician

## 2014-04-08 VITALS — Ht 71.0 in | Wt 254.0 lb

## 2014-04-08 DIAGNOSIS — Z713 Dietary counseling and surveillance: Secondary | ICD-10-CM | POA: Diagnosis not present

## 2014-04-08 DIAGNOSIS — E119 Type 2 diabetes mellitus without complications: Secondary | ICD-10-CM | POA: Insufficient documentation

## 2014-04-08 NOTE — Progress Notes (Signed)
  Medical Nutrition Therapy:  Appt start time: 1715 end time:  1810.  Assessment:  Primary concerns today: Meghan Morales is here today since she is trying to lose weight and she feels like her blood sugar numbers might be going up. Had an Hgb A1c in 09/2013 which was 6.1%. Thinks that she had one done in August and not sure what that number was. Tests blood sugar about 1 x week and last week it was 147 mg/dl in the morning. Has previously had diabetes education.   Would like to get under 200 lbs. Weight has been pretty stable recently. Had gotten down to 245 lbs last year.  Works at the [employer] and works regular business hours. Lives with her husband and youngest daughter and she does the meal preparation at home. Skips about 3 breakfasts and usually just has 2 meals per day on the weekend. Eats out about 4 x week.  Preferred Learning Style:   No preference indicated   Learning Readiness:   Ready  MEDICATIONS: metformin   DIETARY INTAKE:  Usual eating pattern includes 2-3 meals and 1 snacks per day.  Avoided foods include: none    24-hr recall:  B ( AM):weekends - oatmeal or bran flakes with raisins/banana or pack of crackers or [employer] sweet and salty bar or blueberry muffin with coffee with splenda (home) sugar (work) and Engineer, civil (consulting)hazlenut creamer Snk ( AM): sometimes may have raisins and nuts or apple  L ( PM): work [employer] such as Therapist, sportschicken with lima beans, pineapple, with nutty buddy or 1/2 hamburger with cheese and fries  Snk ( PM): none D ( PM): spaghetti or may eat out  Snk ( PM): none Beverages: coffee, water  Usual physical activity: walks 1 mile or walks on the treadmill/elliptical/ weights 5 x week for 30 minutes  Estimated energy needs: 1600 calories 180 g carbohydrates 120 g protein 44 g fat  Progress Towards Goal(s):  In progress.   Nutritional Diagnosis:  NB-1.1 Food and nutrition-related knowledge deficit As related to excess carbohydrate consumption .   As evidenced by diet recall and recently blood sugar reading of 147 mg/dl.    Intervention:  Nutrition counseling provided. Goals:  Follow Diabetes Meal Plan as instructed  Eat 3 meals and 2 snacks, every 3-5 hrs  Limit carbohydrate intake to 30-45 grams carbohydrate/meal  Limit carbohydrate intake to 0-15 grams carbohydrate/snack  Add lean protein foods to meals/snacks  Monitor glucose levels as instructed by your doctor (a few times per week to detect patterns)  Aim for 30 mins of physical activity daily  Aim to fill half of your plate with vegetables and choose 1 starch/carb when you go out to eat  Try [facility] Protein bar  Teaching Method Utilized:  Visual Auditory Hands on  Handouts given during visit include:  Living Well With Diabetes  Yellow Card  Blood Glucose Monitoring  MyPlate Handout  15 g CHO Snacks  Barriers to learning/adherence to lifestyle change: none  Demonstrated degree of understanding via:  Teach Back   Monitoring/Evaluation:  Dietary intake, exercise, and body weight in 2 month(s).

## 2014-04-08 NOTE — Patient Instructions (Addendum)
Goals:  Follow Diabetes Meal Plan as instructed  Eat 3 meals and 2 snacks, every 3-5 hrs  Limit carbohydrate intake to 30-45 grams carbohydrate/meal  Limit carbohydrate intake to 0-15 grams carbohydrate/snack  Add lean protein foods to meals/snacks  Monitor glucose levels as instructed by your doctor (a few times per week to detect patterns)  Aim for 30 mins of physical activity daily  Aim to fill half of your plate with vegetables and choose 1 starch/carb when you go out to eat  Try [facility] Protein bar

## 2014-04-17 ENCOUNTER — Other Ambulatory Visit: Payer: Self-pay | Admitting: Orthopaedic Surgery

## 2014-06-05 NOTE — Pre-Procedure Instructions (Signed)
Meghan Morales  06/05/2014   Your procedure is scheduled on: Tuesday, June 17, 2014  [facility] at 5:30 AM.  Call this number if you have problems the morning of surgery: [phone]   Remember:   Do not eat food or drink liquids after midnight  Monday, June 16, 2014   Take these medicines the morning of surgery with A SIP OF WATER:  SYNTHROID DO NOT TAKE ANY DIABETIC MEDICATION THE MORNING OF PROCEDURE such as metFORMIN (GLUCOPHAGE)   Stop taking Aspirin, vitamins, and herbal medications. Do not take any NSAIDs ie: Ibuprofen, Advil, Naproxen or any medication containing Aspirin such as diclofenac (VOLTAREN)  ; stop 1 week prior to procedure ( Tuesday, June 10, 2014) .  Do not wear jewelry, make-up or nail polish.   Do not wear lotions, powders, or perfumes. You may not wear deodorant.   Do not shave 48 hours prior to surgery.   Do not bring valuables to the hospital.  [facility] is not responsible for any belongings or valuables.               Contacts, dentures or bridgework may not be worn into surgery.  Leave suitcase in the car. After surgery it may be brought to your room.  For patients admitted to the hospital, discharge time is determined by your treatment team.               Patients discharged the day of surgery will not be allowed to drive home.  Name and phone number of your driver:   Special Instructions:  Special Instructions:Special Instructions: [facility] - Pre[facility]  Before surgery, you can play an important role.  Because skin is not sterile, your skin needs to be as free of germs as possible.  You can reduce the number of germs on you skin by washing with CHG (chlorahexidine gluconate) soap before surgery.  CHG is an antiseptic cleaner which kills germs and bonds with the skin to continue killing germs even after washing.  Please DO NOT use if you have an allergy to CHG or antibacterial soaps.  If your skin  becomes reddened/irritated stop using the CHG and inform your nurse when you arrive at Short Stay.  Do not shave (including legs and underarms) for at least 48 hours prior to the first CHG shower.  You may shave your face.  Please follow these instructions carefully:   1.  Shower with CHG Soap the night before surgery and the morning of Surgery.  2.  If you choose to wash your hair, wash your hair first as usual with your normal shampoo.  3.  After you shampoo, rinse your hair and body thoroughly to remove the Shampoo.  4.  Use CHG as you would any other liquid soap.  You can apply chg directly  to the skin and wash gently with scrungie or a clean washcloth.  5.  Apply the CHG Soap to your body ONLY FROM THE NECK DOWN.  Do not use on open wounds or open sores.  Avoid contact with your eyes, ears, mouth and genitals (private parts).  Wash genitals (private parts) with your normal soap.  6.  Wash thoroughly, paying special attention to the area where your surgery will be performed.  7.  Thoroughly rinse your body with warm water from the neck down.  8.  DO NOT shower/wash with your normal soap after using and rinsing off the CHG Soap.  9.  Pat yourself dry with a clean towel.            10.  Wear clean pajamas.            11.  Place clean sheets on your bed the night of your first shower and do not sleep with pets.  Day of Surgery  Do not apply any lotions/deodorants the morning of surgery.  Please wear clean clothes to the hospital/surgery center.   Please read over the following fact sheets that you were given: Pain Booklet, Coughing and Deep Breathing, Blood Transfusion Information, Total Joint Packet, MRSA Information and Surgical Site Infection Prevention

## 2014-06-09 ENCOUNTER — Encounter (HOSPITAL_COMMUNITY): Payer: Self-pay

## 2014-06-09 ENCOUNTER — Encounter (HOSPITAL_COMMUNITY)
Admission: RE | Admit: 2014-06-09 | Discharge: 2014-06-09 | Disposition: A | Discharge status: Home / Self Care | Payer: 59 | Source: Ambulatory Visit | Attending: Orthopaedic Surgery | Admitting: Orthopaedic Surgery

## 2014-06-09 DIAGNOSIS — Z01818 Encounter for other preprocedural examination: Secondary | ICD-10-CM

## 2014-06-09 HISTORY — DX: Unspecified osteoarthritis, unspecified site: M19.90

## 2014-06-09 HISTORY — DX: Hypothyroidism, unspecified: E03.9

## 2014-06-09 LAB — BASIC METABOLIC PANEL
Anion gap: 7 (ref 5–15)
BUN: 11 mg/dL (ref 6–23)
CALCIUM: 9.1 mg/dL (ref 8.4–10.5)
CO2: 28 mmol/L (ref 19–32)
CREATININE: 0.72 mg/dL (ref 0.50–1.10)
Chloride: 104 mEq/L (ref 96–112)
GFR calc non Af Amer: >90 mL/min (ref >90)
GLUCOSE: 119 mg/dL — AB (ref 70–99)
POTASSIUM: 3.4 mmol/L — AB (ref 3.5–5.1)
SODIUM: 139 mmol/L (ref 135–145)

## 2014-06-09 LAB — CBC WITH DIFFERENTIAL/PLATELET
BASOS ABS: 0 10*3/uL (ref 0.0–0.1)
Basophils Relative: 0 % (ref 0–1)
Eosinophils Absolute: 0.2 10*3/uL (ref 0.0–0.7)
Eosinophils Relative: 3 % (ref 0–5)
HEMATOCRIT: 37.1 % (ref 36.0–46.0)
HEMOGLOBIN: 11.9 g/dL — AB (ref 12.0–15.0)
Lymphocytes Relative: 20 % (ref 12–46)
Lymphs Abs: 1.1 10*3/uL (ref 0.7–4.0)
MCH: 25.6 pg — ABNORMAL LOW (ref 26.0–34.0)
MCHC: 32.1 g/dL (ref 30.0–36.0)
MCV: 79.8 fL (ref 78.0–100.0)
Monocytes Absolute: 0.4 10*3/uL (ref 0.1–1.0)
Monocytes Relative: 6 % (ref 3–12)
Neutro Abs: 4 10*3/uL (ref 1.7–7.7)
Neutrophils Relative %: 71 % (ref 43–77)
Platelets: 354 10*3/uL (ref 150–400)
RBC: 4.65 MIL/uL (ref 3.87–5.11)
RDW: 14.2 % (ref 11.5–15.5)
WBC: 5.6 10*3/uL (ref 4.0–10.5)

## 2014-06-09 LAB — APTT: aPTT: 28 seconds (ref 24–37)

## 2014-06-09 LAB — PROTIME-INR
INR: 0.96 (ref 0.00–1.49)
PROTHROMBIN TIME: 12.9 s (ref 11.6–15.2)

## 2014-06-09 LAB — TYPE AND SCREEN
ABO/RH(D): O POS
Antibody Screen: NEGATIVE

## 2014-06-09 LAB — SURGICAL PCR SCREEN
MRSA, PCR: NEGATIVE
Staphylococcus aureus: NEGATIVE

## 2014-06-09 LAB — HCG, SERUM, QUALITATIVE: PREG SERUM: NEGATIVE

## 2014-06-09 MED ORDER — CEFAZOLIN SODIUM-DEXTROSE 2-3 GM-% IV SOLR: 2.0000 g | INTRAVENOUS | Status: DC

## 2014-06-09 MED ORDER — LACTATED RINGERS IV SOLN: INTRAVENOUS | Status: DC

## 2014-06-09 NOTE — Progress Notes (Signed)
Pt. Saw cardiology several yrs. Ago, Cath. was normal. Pt. Reports that she had an ekg at that time & none since. Pt. Followed by Dr. Lowella BandyV. Bland for PCP.

## 2014-06-09 NOTE — Progress Notes (Signed)
Pharmacy called to capture MED. Profile from pt.

## 2014-06-10 LAB — ABO/RH: ABO/RH(D): O POS

## 2014-06-10 LAB — URINE CULTURE
CULTURE: NO GROWTH
Colony Count: NO GROWTH

## 2014-06-12 NOTE — H&P (Signed)
TOTAL KNEE ADMISSION H&P  Patient is being admitted for left total knee arthroplasty.  Subjective:  Chief Complaint:left knee pain.  HPI: Meghan Morales, 53 y.o. female, has a history of pain and functional disability in the left knee due to arthritis and has failed non-surgical conservative treatments for greater than 12 weeks to includeNSAID's and/or analgesics, corticosteriod injections, viscosupplementation injections, flexibility and strengthening excercises, weight reduction as appropriate and activity modification.  Onset of symptoms was gradual, starting 5 years ago with gradually worsening course since that time. The patient noted no past surgery on the left knee(s).  Patient currently rates pain in the left knee(s) at 10 out of 10 with activity. Patient has night pain, worsening of pain with activity and weight bearing, pain that interferes with activities of daily living, crepitus and joint swelling.  Patient has evidence of subchondral cysts, subchondral sclerosis, periarticular osteophytes and joint space narrowing by imaging studies. There is no active infection.  There are no active problems to display for this patient.  Past Medical History  Diagnosis Date  . Diabetes mellitus without complication   . Hypertension   . Hypothyroidism   . Arthritis     knees & back arithrits     Past Surgical History  Procedure Laterality Date  . Tubal ligation    . Cholecystectomy    . Knee surgery      arthroscopy  . Cardiac catheterization      No prescriptions prior to admission   No Known Allergies  History  Substance Use Topics  . Smoking status: Never Smoker   . Smokeless tobacco: Not on file  . Alcohol Use: No    No family history on file.   Review of Systems  Musculoskeletal: Positive for joint pain.       Left knee  All other systems reviewed and are negative.   Objective:  Physical Exam  Constitutional: She is oriented to person, place, and time. She appears  well-developed and well-nourished.  HENT:  Head: Normocephalic and atraumatic.  Eyes: Conjunctivae and EOM are normal. Pupils are equal, round, and reactive to light.  Neck: Normal range of motion.  Cardiovascular: Normal rate and regular rhythm.   Respiratory: Effort normal.  GI: Soft.  Musculoskeletal:  Both knees move about 0-100.  She has some healed portals.  Calves are soft and nontender.  I cannot feel an effusion on either side.  She has mild varus deformities.   Hip motion is full and pain free and SLR is negative on both sides.  There is no palpable LAD behind either knee.  Sensation and motor function are intact on both sides and there are palpable pulses on both sides.    Neurological: She is alert and oriented to person, place, and time.  Skin: Skin is warm and dry.  Psychiatric: She has a normal mood and affect. Her behavior is normal. Judgment and thought content normal.    Vital signs in last 24 hours:    Labs:   Estimated body mass index is 35.44 kg/(m^2) as calculated from the following:   Height as of 04/08/14: 5\' 11"  (1.803 m).   Weight as of 04/08/14: 115.214 kg (254 lb).   Imaging Review Plain radiographs demonstrate severe degenerative joint disease of the left knee(s). The overall alignment isneutral. The bone quality appears to be good for age and reported activity level.  Assessment/Plan:  End stage primary arthritis, left knee   The patient history, physical examination, clinical judgment of the  provider and imaging studies are consistent with end stage degenerative joint disease of the left knee(s) and total knee arthroplasty is deemed medically necessary. The treatment options including medical management, injection therapy arthroscopy and arthroplasty were discussed at length. The risks and benefits of total knee arthroplasty were presented and reviewed. The risks due to aseptic loosening, infection, stiffness, patella tracking problems, thromboembolic  complications and other imponderables were discussed. The patient acknowledged the explanation, agreed to proceed with the plan and consent was signed. Patient is being admitted for inpatient treatment for surgery, pain control, PT, OT, prophylactic antibiotics, VTE prophylaxis, progressive ambulation and ADL's and discharge planning. The patient is planning to be discharged home with home health services

## 2014-06-17 ENCOUNTER — Inpatient Hospital Stay (HOSPITAL_COMMUNITY): Payer: Medicare Other | Admitting: Certified Registered"

## 2014-06-17 ENCOUNTER — Inpatient Hospital Stay (HOSPITAL_COMMUNITY)
Admission: RE | Admit: 2014-06-17 | Discharge: 2014-06-19 | DRG: 470 | Disposition: A | Discharge status: Home / Self Care | Payer: Medicare Other | Source: Ambulatory Visit | Attending: Orthopaedic Surgery | Admitting: Orthopaedic Surgery

## 2014-06-17 ENCOUNTER — Encounter (HOSPITAL_COMMUNITY)
Admission: RE | Disposition: A | Discharge status: Home / Self Care | Payer: Self-pay | Source: Ambulatory Visit | Attending: Orthopaedic Surgery

## 2014-06-17 ENCOUNTER — Encounter (HOSPITAL_COMMUNITY): Payer: Self-pay | Admitting: *Deleted

## 2014-06-17 DIAGNOSIS — M1710 Unilateral primary osteoarthritis, unspecified knee: Secondary | ICD-10-CM | POA: Diagnosis present

## 2014-06-17 DIAGNOSIS — E119 Type 2 diabetes mellitus without complications: Secondary | ICD-10-CM | POA: Diagnosis present

## 2014-06-17 DIAGNOSIS — Z6835 Body mass index (BMI) 35.0-35.9, adult: Secondary | ICD-10-CM

## 2014-06-17 DIAGNOSIS — E039 Hypothyroidism, unspecified: Secondary | ICD-10-CM | POA: Diagnosis present

## 2014-06-17 DIAGNOSIS — I10 Essential (primary) hypertension: Secondary | ICD-10-CM | POA: Diagnosis present

## 2014-06-17 DIAGNOSIS — M1712 Unilateral primary osteoarthritis, left knee: Secondary | ICD-10-CM | POA: Diagnosis present

## 2014-06-17 DIAGNOSIS — E669 Obesity, unspecified: Secondary | ICD-10-CM | POA: Diagnosis present

## 2014-06-17 HISTORY — DX: Urgency of urination: R39.15

## 2014-06-17 HISTORY — PX: TOTAL KNEE ARTHROPLASTY: SHX125

## 2014-06-17 LAB — GLUCOSE, CAPILLARY
GLUCOSE-CAPILLARY: 116 mg/dL — AB (ref 70–99)
GLUCOSE-CAPILLARY: 164 mg/dL — AB (ref 70–99)
Glucose-Capillary: 113 mg/dL — ABNORMAL HIGH (ref 70–99)
Glucose-Capillary: 202 mg/dL — ABNORMAL HIGH (ref 70–99)
Glucose-Capillary: 152 mg/dL — ABNORMAL HIGH (ref 70–99)

## 2014-06-17 SURGERY — ARTHROPLASTY, KNEE, TOTAL
Anesthesia: Monitor Anesthesia Care | Site: Knee | Laterality: Left

## 2014-06-17 MED ORDER — PROPOFOL 10 MG/ML IV BOLUS
INTRAVENOUS | Status: AC
  Filled 2014-06-17: qty 20

## 2014-06-17 MED ORDER — ROCURONIUM BROMIDE 50 MG/5ML IV SOLN
INTRAVENOUS | Status: AC
  Filled 2014-06-17: qty 1

## 2014-06-17 MED ORDER — ONDANSETRON HCL 4 MG/2ML IJ SOLN
4.0000 mg | Freq: Four times a day (QID) | INTRAMUSCULAR | Status: DC | PRN
  Administered 2014-06-17: 4 mg via INTRAVENOUS
  Filled 2014-06-17: qty 2

## 2014-06-17 MED ORDER — LIDOCAINE HCL (CARDIAC) 20 MG/ML IV SOLN
INTRAVENOUS | Status: AC
  Filled 2014-06-17: qty 5

## 2014-06-17 MED ORDER — METHOCARBAMOL 500 MG PO TABS
500.0000 mg | ORAL_TABLET | Freq: Four times a day (QID) | ORAL | Status: DC | PRN
  Administered 2014-06-18: 500 mg via ORAL
  Filled 2014-06-17 (×2): qty 1

## 2014-06-17 MED ORDER — METHOCARBAMOL 1000 MG/10ML IJ SOLN
500.0000 mg | Freq: Four times a day (QID) | INTRAVENOUS | Status: DC | PRN
  Filled 2014-06-17: qty 5

## 2014-06-17 MED ORDER — ACETAMINOPHEN 650 MG RE SUPP: 650.0000 mg | Freq: Four times a day (QID) | RECTAL | Status: DC | PRN

## 2014-06-17 MED ORDER — METOCLOPRAMIDE HCL 5 MG/ML IJ SOLN
5.0000 mg | Freq: Three times a day (TID) | INTRAMUSCULAR | Status: DC | PRN
  Administered 2014-06-17: 10 mg via INTRAVENOUS
  Filled 2014-06-17: qty 2

## 2014-06-17 MED ORDER — MIDAZOLAM HCL 2 MG/2ML IJ SOLN
INTRAMUSCULAR | Status: AC
  Filled 2014-06-17: qty 2

## 2014-06-17 MED ORDER — ACETAMINOPHEN 325 MG PO TABS: 650.0000 mg | ORAL_TABLET | Freq: Four times a day (QID) | ORAL | Status: DC | PRN

## 2014-06-17 MED ORDER — HYDROMORPHONE HCL 1 MG/ML IJ SOLN
INTRAMUSCULAR | Status: AC
  Filled 2014-06-17: qty 1

## 2014-06-17 MED ORDER — SODIUM CHLORIDE 0.9 % IR SOLN
Status: DC | PRN
  Administered 2014-06-17: 3000 mL

## 2014-06-17 MED ORDER — HYDROMORPHONE HCL 1 MG/ML IJ SOLN
0.5000 mg | INTRAMUSCULAR | Status: DC | PRN
  Administered 2014-06-17: 1 mg via INTRAVENOUS
  Filled 2014-06-17: qty 1

## 2014-06-17 MED ORDER — TELMISARTAN-HCTZ 80-25 MG PO TABS: 1.0000 | ORAL_TABLET | Freq: Every day | ORAL | Status: DC

## 2014-06-17 MED ORDER — FENTANYL CITRATE 0.05 MG/ML IJ SOLN
INTRAMUSCULAR | Status: DC | PRN
  Administered 2014-06-17 (×3): 50 ug via INTRAVENOUS

## 2014-06-17 MED ORDER — PROPOFOL INFUSION 10 MG/ML OPTIME
INTRAVENOUS | Status: DC | PRN
  Administered 2014-06-17: 75 ug/kg/min via INTRAVENOUS

## 2014-06-17 MED ORDER — BUPIVACAINE LIPOSOME 1.3 % IJ SUSP
20.0000 mL | INTRAMUSCULAR | Status: DC
  Filled 2014-06-17: qty 20

## 2014-06-17 MED ORDER — PHENYLEPHRINE 40 MCG/ML (10ML) SYRINGE FOR IV PUSH (FOR BLOOD PRESSURE SUPPORT)
PREFILLED_SYRINGE | INTRAVENOUS | Status: AC
  Filled 2014-06-17: qty 10

## 2014-06-17 MED ORDER — FESOTERODINE FUMARATE ER 8 MG PO TB24
8.0000 mg | ORAL_TABLET | Freq: Every day | ORAL | Status: DC
  Administered 2014-06-17 – 2014-06-19 (×3): 8 mg via ORAL
  Filled 2014-06-17 (×3): qty 1

## 2014-06-17 MED ORDER — TRANEXAMIC ACID 100 MG/ML IV SOLN
2000.0000 mg | INTRAVENOUS | Status: DC | PRN
  Administered 2014-06-17: 2000 mg via TOPICAL

## 2014-06-17 MED ORDER — BISACODYL 5 MG PO TBEC: 5.0000 mg | DELAYED_RELEASE_TABLET | Freq: Every day | ORAL | Status: DC | PRN

## 2014-06-17 MED ORDER — METHOCARBAMOL 1000 MG/10ML IJ SOLN
500.0000 mg | INTRAMUSCULAR | Status: AC
  Administered 2014-06-17: 500 mg via INTRAVENOUS
  Filled 2014-06-17: qty 5

## 2014-06-17 MED ORDER — ONDANSETRON HCL 4 MG/2ML IJ SOLN: 4.0000 mg | Freq: Once | INTRAMUSCULAR | Status: DC | PRN

## 2014-06-17 MED ORDER — METOCLOPRAMIDE HCL 5 MG PO TABS
5.0000 mg | ORAL_TABLET | Freq: Three times a day (TID) | ORAL | Status: DC | PRN
  Filled 2014-06-17: qty 2

## 2014-06-17 MED ORDER — SODIUM CHLORIDE 0.9 % IV SOLN
INTRAVENOUS | Status: DC | PRN
  Administered 2014-06-17: 40 mL

## 2014-06-17 MED ORDER — LEVOTHYROXINE SODIUM 175 MCG PO TABS
175.0000 ug | ORAL_TABLET | Freq: Every day | ORAL | Status: DC
  Administered 2014-06-18 – 2014-06-19 (×2): 175 ug via ORAL
  Filled 2014-06-17 (×3): qty 1

## 2014-06-17 MED ORDER — HYDROMORPHONE HCL 1 MG/ML IJ SOLN
INTRAMUSCULAR | Status: AC
Start: 2014-06-17 — End: 2014-06-17
  Filled 2014-06-17: qty 1

## 2014-06-17 MED ORDER — FENTANYL CITRATE 0.05 MG/ML IJ SOLN
INTRAMUSCULAR | Status: AC
  Filled 2014-06-17: qty 5

## 2014-06-17 MED ORDER — INSULIN ASPART 100 UNIT/ML ~~LOC~~ SOLN: 0.0000 [IU] | Freq: Three times a day (TID) | SUBCUTANEOUS | Status: DC

## 2014-06-17 MED ORDER — IRBESARTAN 300 MG PO TABS
300.0000 mg | ORAL_TABLET | Freq: Every day | ORAL | Status: DC
  Administered 2014-06-17 – 2014-06-19 (×3): 300 mg via ORAL
  Filled 2014-06-17 (×3): qty 1

## 2014-06-17 MED ORDER — HYDROCHLOROTHIAZIDE 25 MG PO TABS
25.0000 mg | ORAL_TABLET | Freq: Every day | ORAL | Status: DC
  Administered 2014-06-17 – 2014-06-19 (×3): 25 mg via ORAL
  Filled 2014-06-17 (×3): qty 1

## 2014-06-17 MED ORDER — METFORMIN HCL 500 MG PO TABS
500.0000 mg | ORAL_TABLET | Freq: Every day | ORAL | Status: DC
  Administered 2014-06-18 – 2014-06-19 (×2): 500 mg via ORAL
  Filled 2014-06-17 (×3): qty 1

## 2014-06-17 MED ORDER — MENTHOL 3 MG MT LOZG: 1.0000 | LOZENGE | OROMUCOSAL | Status: DC | PRN

## 2014-06-17 MED ORDER — FENTANYL CITRATE 0.05 MG/ML IJ SOLN: 25.0000 ug | INTRAMUSCULAR | Status: DC | PRN

## 2014-06-17 MED ORDER — SODIUM CHLORIDE 0.9 % IJ SOLN
INTRAMUSCULAR | Status: AC
  Filled 2014-06-17: qty 10

## 2014-06-17 MED ORDER — PHENOL 1.4 % MT LIQD: 1.0000 | OROMUCOSAL | Status: DC | PRN

## 2014-06-17 MED ORDER — ONDANSETRON HCL 4 MG PO TABS: 4.0000 mg | ORAL_TABLET | Freq: Four times a day (QID) | ORAL | Status: DC | PRN

## 2014-06-17 MED ORDER — CEFAZOLIN SODIUM-DEXTROSE 2-3 GM-% IV SOLR
INTRAVENOUS | Status: AC
  Filled 2014-06-17: qty 50

## 2014-06-17 MED ORDER — ONDANSETRON HCL 4 MG/2ML IJ SOLN
INTRAMUSCULAR | Status: DC | PRN
  Administered 2014-06-17: 4 mg via INTRAVENOUS

## 2014-06-17 MED ORDER — DILTIAZEM HCL ER 120 MG PO CP24
120.0000 mg | ORAL_CAPSULE | Freq: Every day | ORAL | Status: DC
  Administered 2014-06-17 – 2014-06-19 (×3): 120 mg via ORAL
  Filled 2014-06-17 (×3): qty 1

## 2014-06-17 MED ORDER — MIDAZOLAM HCL 5 MG/5ML IJ SOLN
INTRAMUSCULAR | Status: DC | PRN
  Administered 2014-06-17 (×2): 2 mg via INTRAVENOUS

## 2014-06-17 MED ORDER — KETOROLAC TROMETHAMINE 30 MG/ML IJ SOLN
30.0000 mg | Freq: Once | INTRAMUSCULAR | Status: AC | PRN
  Administered 2014-06-17: 30 mg via INTRAVENOUS

## 2014-06-17 MED ORDER — HYDROCODONE-ACETAMINOPHEN 5-325 MG PO TABS
1.0000 | ORAL_TABLET | ORAL | Status: DC | PRN
  Administered 2014-06-17 – 2014-06-19 (×6): 2 via ORAL
  Filled 2014-06-17 (×6): qty 2

## 2014-06-17 MED ORDER — ALUM & MAG HYDROXIDE-SIMETH 200-200-20 MG/5ML PO SUSP: 30.0000 mL | ORAL | Status: DC | PRN

## 2014-06-17 MED ORDER — LACTATED RINGERS IV SOLN
INTRAVENOUS | Status: DC
  Administered 2014-06-17: 20:00:00 via INTRAVENOUS

## 2014-06-17 MED ORDER — DOCUSATE SODIUM 100 MG PO CAPS
100.0000 mg | ORAL_CAPSULE | Freq: Two times a day (BID) | ORAL | Status: DC
  Administered 2014-06-17 – 2014-06-19 (×5): 100 mg via ORAL
  Filled 2014-06-17 (×6): qty 1

## 2014-06-17 MED ORDER — ASPIRIN EC 325 MG PO TBEC
325.0000 mg | DELAYED_RELEASE_TABLET | Freq: Two times a day (BID) | ORAL | Status: DC
  Administered 2014-06-18 – 2014-06-19 (×3): 325 mg via ORAL
  Filled 2014-06-17 (×5): qty 1

## 2014-06-17 MED ORDER — SODIUM CHLORIDE 0.9 % IR SOLN
Status: DC | PRN
  Administered 2014-06-17: 1000 mL

## 2014-06-17 MED ORDER — PHENYLEPHRINE HCL 10 MG/ML IJ SOLN
10.0000 mg | INTRAMUSCULAR | Status: DC | PRN
  Administered 2014-06-17: 30 ug/min via INTRAVENOUS

## 2014-06-17 MED ORDER — SUCCINYLCHOLINE CHLORIDE 20 MG/ML IJ SOLN
INTRAMUSCULAR | Status: AC
  Filled 2014-06-17: qty 1

## 2014-06-17 MED ORDER — LACTATED RINGERS IV SOLN
INTRAVENOUS | Status: DC | PRN
  Administered 2014-06-17 (×2): via INTRAVENOUS

## 2014-06-17 MED ORDER — KETOROLAC TROMETHAMINE 30 MG/ML IJ SOLN
INTRAMUSCULAR | Status: AC
  Filled 2014-06-17: qty 1

## 2014-06-17 MED ORDER — CEFAZOLIN SODIUM-DEXTROSE 2-3 GM-% IV SOLR
2.0000 g | Freq: Four times a day (QID) | INTRAVENOUS | Status: AC
  Administered 2014-06-17 (×2): 2 g via INTRAVENOUS
  Filled 2014-06-17 (×2): qty 50

## 2014-06-17 MED ORDER — HYDROMORPHONE HCL 1 MG/ML IJ SOLN
0.2500 mg | INTRAMUSCULAR | Status: DC | PRN
  Administered 2014-06-17 (×4): 0.5 mg via INTRAVENOUS

## 2014-06-17 MED ORDER — PHENYLEPHRINE HCL 10 MG/ML IJ SOLN
INTRAMUSCULAR | Status: DC | PRN
  Administered 2014-06-17 (×4): 80 ug via INTRAVENOUS

## 2014-06-17 MED ORDER — TRANEXAMIC ACID 100 MG/ML IV SOLN
2000.0000 mg | INTRAVENOUS | Status: DC
  Filled 2014-06-17: qty 20

## 2014-06-17 MED ORDER — DIPHENHYDRAMINE HCL 12.5 MG/5ML PO ELIX: 12.5000 mg | ORAL_SOLUTION | ORAL | Status: DC | PRN

## 2014-06-17 MED ORDER — EPHEDRINE SULFATE 50 MG/ML IJ SOLN
INTRAMUSCULAR | Status: AC
  Filled 2014-06-17: qty 1

## 2014-06-17 MED ORDER — CEFAZOLIN SODIUM-DEXTROSE 2-3 GM-% IV SOLR
INTRAVENOUS | Status: DC | PRN
  Administered 2014-06-17: 2 g via INTRAVENOUS

## 2014-06-17 SURGICAL SUPPLY — 72 items
APL SKNCLS STERI-STRIP NONHPOA (GAUZE/BANDAGES/DRESSINGS) ×1
BAG DECANTER FOR FLEXI CONT (MISCELLANEOUS) ×3 IMPLANT
BANDAGE ELASTIC 4 VELCRO ST LF (GAUZE/BANDAGES/DRESSINGS) ×3 IMPLANT
BANDAGE ESMARK 6X9 LF (GAUZE/BANDAGES/DRESSINGS) ×1 IMPLANT
BENZOIN TINCTURE PRP APPL 2/3 (GAUZE/BANDAGES/DRESSINGS) ×3 IMPLANT
BLADE SAGITTAL 25.0X1.19X90 (BLADE) ×2 IMPLANT
BLADE SAGITTAL 25.0X1.19X90MM (BLADE) ×2
BLADE SAW SGTL 13.0X1.19X90.0M (BLADE) IMPLANT
BLADE SURG ROTATE 9660 (MISCELLANEOUS) IMPLANT
BNDG CMPR 9X6 STRL LF SNTH (GAUZE/BANDAGES/DRESSINGS) ×1
BNDG CMPR MED 10X6 ELC LF (GAUZE/BANDAGES/DRESSINGS) ×1
BNDG ELASTIC 6X10 VLCR STRL LF (GAUZE/BANDAGES/DRESSINGS) ×3 IMPLANT
BNDG ESMARK 6X9 LF (GAUZE/BANDAGES/DRESSINGS) ×3
BNDG GAUZE ELAST 4 BULKY (GAUZE/BANDAGES/DRESSINGS) ×6 IMPLANT
BOWL SMART MIX CTS (DISPOSABLE) ×3 IMPLANT
CAP KNEE TOTAL 3 SIGMA ×2 IMPLANT
CEMENT HV SMART SET (Cement) ×6 IMPLANT
CLOSURE WOUND 1/2 X4 (GAUZE/BANDAGES/DRESSINGS) ×2
COVER SURGICAL LIGHT HANDLE (MISCELLANEOUS) ×3 IMPLANT
CUFF TOURNIQUET SINGLE 34IN LL (TOURNIQUET CUFF) ×3 IMPLANT
CUFF TOURNIQUET SINGLE 44IN (TOURNIQUET CUFF) IMPLANT
DRAPE EXTREMITY T 121X128X90 (DRAPE) ×3 IMPLANT
DRAPE IMP U-DRAPE 54X76 (DRAPES) ×3 IMPLANT
DRAPE PROXIMA HALF (DRAPES) ×3 IMPLANT
DRAPE U-SHAPE 47X51 STRL (DRAPES) ×3 IMPLANT
DRSG ADAPTIC 3X8 NADH LF (GAUZE/BANDAGES/DRESSINGS) ×3 IMPLANT
DRSG PAD ABDOMINAL 8X10 ST (GAUZE/BANDAGES/DRESSINGS) ×5 IMPLANT
DURAPREP 26ML APPLICATOR (WOUND CARE) ×5 IMPLANT
ELECT CAUTERY BLADE 6.4 (BLADE) ×2 IMPLANT
ELECT REM PT RETURN 9FT ADLT (ELECTROSURGICAL) ×3
ELECTRODE REM PT RTRN 9FT ADLT (ELECTROSURGICAL) ×1 IMPLANT
GAUZE SPONGE 4X4 12PLY STRL (GAUZE/BANDAGES/DRESSINGS) ×3 IMPLANT
GLOVE BIO SURGEON STRL SZ8 (GLOVE) ×6 IMPLANT
GLOVE BIOGEL PI IND STRL 8 (GLOVE) ×2 IMPLANT
GLOVE BIOGEL PI INDICATOR 8 (GLOVE) ×4
GOWN STRL REUS W/ TWL LRG LVL3 (GOWN DISPOSABLE) ×1 IMPLANT
GOWN STRL REUS W/ TWL XL LVL3 (GOWN DISPOSABLE) ×2 IMPLANT
GOWN STRL REUS W/TWL LRG LVL3 (GOWN DISPOSABLE) ×3
GOWN STRL REUS W/TWL XL LVL3 (GOWN DISPOSABLE) ×6
HANDPIECE INTERPULSE COAX TIP (DISPOSABLE) ×3
HOOD PEEL AWAY FACE SHEILD DIS (HOOD) ×8 IMPLANT
IMMOBILIZER KNEE 20 (SOFTGOODS) IMPLANT
IMMOBILIZER KNEE 22 UNIV (SOFTGOODS) ×3 IMPLANT
IMMOBILIZER KNEE 24 THIGH 36 (MISCELLANEOUS) IMPLANT
IMMOBILIZER KNEE 24 UNIV (MISCELLANEOUS)
KIT BASIN OR (CUSTOM PROCEDURE TRAY) ×3 IMPLANT
KIT ROOM TURNOVER OR (KITS) ×3 IMPLANT
MANIFOLD NEPTUNE II (INSTRUMENTS) ×3 IMPLANT
NDL HYPO 21X1 ECLIPSE (NEEDLE) ×1 IMPLANT
NEEDLE 22X1 1/2 (OR ONLY) (NEEDLE) ×2 IMPLANT
NEEDLE HYPO 21X1 ECLIPSE (NEEDLE) IMPLANT
NS IRRIG 1000ML POUR BTL (IV SOLUTION) ×3 IMPLANT
PACK TOTAL JOINT (CUSTOM PROCEDURE TRAY) ×3 IMPLANT
PACK UNIVERSAL I (CUSTOM PROCEDURE TRAY) ×3 IMPLANT
PAD ARMBOARD 7.5X6 YLW CONV (MISCELLANEOUS) ×6 IMPLANT
SET HNDPC FAN SPRY TIP SCT (DISPOSABLE) ×1 IMPLANT
STAPLER VISISTAT 35W (STAPLE) IMPLANT
STRIP CLOSURE SKIN 1/2X4 (GAUZE/BANDAGES/DRESSINGS) ×4 IMPLANT
SUCTION FRAZIER TIP 10 FR DISP (SUCTIONS) ×3 IMPLANT
SUT MNCRL AB 3-0 PS2 18 (SUTURE) ×3 IMPLANT
SUT VIC AB 0 CT1 27 (SUTURE) ×6
SUT VIC AB 0 CT1 27XBRD ANBCTR (SUTURE) ×2 IMPLANT
SUT VIC AB 2-0 CT1 27 (SUTURE) ×6
SUT VIC AB 2-0 CT1 TAPERPNT 27 (SUTURE) ×2 IMPLANT
SUT VLOC 180 0 24IN GS25 (SUTURE) ×3 IMPLANT
SYR 50ML LL SCALE MARK (SYRINGE) ×3 IMPLANT
TOWEL OR 17X24 6PK STRL BLUE (TOWEL DISPOSABLE) ×3 IMPLANT
TOWEL OR 17X26 10 PK STRL BLUE (TOWEL DISPOSABLE) ×3 IMPLANT
TRAY CATH 16FR W/PLASTIC CATH (SET/KITS/TRAYS/PACK) ×2 IMPLANT
TRAY FOLEY CATH 14FR (SET/KITS/TRAYS/PACK) IMPLANT
UPCHARGE REV TRAY MBT KNEE ×2 IMPLANT
WATER STERILE IRR 1000ML POUR (IV SOLUTION) ×2 IMPLANT

## 2014-06-17 NOTE — Anesthesia Preprocedure Evaluation (Addendum)
Anesthesia Evaluation  Patient identified by MRN, date of birth, ID band Patient awake    Reviewed: Allergy & Precautions, NPO status , Patient's Chart, lab work & pertinent test results  Airway Mallampati: II  TM Distance: >3 FB Neck ROM: Full    Dental no notable dental hx. (+) Teeth Intact, Dental Advisory Given   Pulmonary neg pulmonary ROS,  breath sounds clear to auscultation  Pulmonary exam normal       Cardiovascular hypertension, Pt. on medications Rhythm:Regular Rate:Normal     Neuro/Psych negative neurological ROS  negative psych ROS   GI/Hepatic negative GI ROS, Neg liver ROS,   Endo/Other  diabetes, Type 2, Oral Hypoglycemic AgentsHypothyroidism   Renal/GU negative Renal ROS  negative genitourinary   Musculoskeletal  (+) Arthritis -, Osteoarthritis,    Abdominal   Peds negative pediatric ROS (+)  Hematology negative hematology ROS (+)   Anesthesia Other Findings   Reproductive/Obstetrics negative OB ROS                           Anesthesia Physical Anesthesia Plan  ASA: III  Anesthesia Plan: MAC and Spinal   Post-op Pain Management:    Induction: Intravenous  Airway Management Planned: Simple Face Mask  Additional Equipment:   Intra-op Plan:   Post-operative Plan:   Informed Consent: I have reviewed the patients History and Physical, chart, labs and discussed the procedure including the risks, benefits and alternatives for the proposed anesthesia with the patient or authorized representative who has indicated his/her understanding and acceptance.   Dental advisory given  Plan Discussed with: CRNA  Anesthesia Plan Comments:        Anesthesia Quick Evaluation

## 2014-06-17 NOTE — Plan of Care (Signed)
Problem: Consults Goal: Diagnosis- Total Joint Replacement Primary Total Knee Left     

## 2014-06-17 NOTE — Anesthesia Procedure Notes (Signed)
Spinal Patient location during procedure: OR Start time: 06/17/2014 7:40 AM End time: 06/17/2014 7:45 AM Staffing Performed by: anesthesiologist  Preanesthetic Checklist Completed: patient identified, site marked, surgical consent, pre-op evaluation, timeout performed, IV checked, risks and benefits discussed and monitors and equipment checked Spinal Block Patient position: sitting Prep: ChloraPrep Patient monitoring: heart rate, cardiac monitor, continuous pulse ox and blood pressure Approach: midline Location: L3-4 Injection technique: single-shot Needle Needle type: Tuohy  Needle gauge: 22 G Needle length: 9 cm Needle insertion depth: 7 cm Assessment Sensory level: T10 Additional Notes 10 mg 0.75% Marcaine with 1:200 epi injected easily

## 2014-06-17 NOTE — Progress Notes (Signed)
Orthopedic Tech Progress Note Patient Details:  Meghan NettleMarcia W Morales 04/15/1961 [id number]  CPM Left Knee CPM Left Knee: On Left Knee Flexion (Degrees): 80 Left Knee Extension (Degrees): 0 Additional Comments: trapeze bar patient helper Viewed order from doctor's order list  [name], [name] 06/17/2014, 3:28 PM

## 2014-06-17 NOTE — Evaluation (Signed)
Physical Therapy Evaluation Patient Details Name: Meghan Morales MRN: [id number] DOB: 04-Aug-1960 Today's Date: 06/17/2014   History of Present Illness  Patient is a 54 y/o female s/p L TKA. PMH of DM, and HTN.  Clinical Impression  Patient presents with generalized weakness LLE, pain, nausea and lethargy s/p above surgery impacting safe mobility. Pt with + emesis during mobility today limiting assessment. Tolerated SPT to [facility] and side stepping along side bed with Min A for safety. Instructed pt in HEP. Pt reports having supportive family at home to assist at discharge. Pt would benefit from skilled PT to improve transfers, gait, balance and mobility so pt can maximize independence and return to PLOF.    Follow Up Recommendations Home health PT;Supervision/Assistance - 24 hour    Equipment Recommendations  None recommended by PT    Recommendations for Other Services       Precautions / Restrictions Precautions Precautions: Knee;Fall Precaution Booklet Issued: No Precaution Comments: Reviewed precautions and HEP. Required Braces or Orthoses: Knee Immobilizer - Left Restrictions Weight Bearing Restrictions: Yes LLE Weight Bearing: Weight bearing as tolerated      Mobility  Bed Mobility Overal bed mobility: Needs Assistance Bed Mobility: Supine to Sit;Sit to Supine     Supine to sit: Min assist;HOB elevated Sit to supine: Min assist;HOB elevated   General bed mobility comments: Min A to bring LLE to/from bed. Increased time. + nausea and + emesis upon sitting EOB.  Performed supine to/from sit x2 due to N/V.  Transfers Overall transfer level: Needs assistance Equipment used: Rolling walker (2 wheeled) Transfers: Sit to/from [employer] Sit to Stand: Min guard Stand pivot transfers: Min guard       General transfer comment: Min guard for safety/balance to stand from EOB x2 from [facility] x1. SPT bed<-> BSC x1 with cues for RW  management.  Ambulation/Gait Ambulation/Gait assistance: Min assist Ambulation Distance (Feet): 5 Feet Assistive device: Rolling walker (2 wheeled) Gait Pattern/deviations: Step-to pattern;Decreased stride length;Decreased stance time - left;Decreased weight shift to left;Trunk flexed     General Gait Details: Pt tolerated taking a few steps along side bed to the left and to [facility]. Cues for RW management and Min A for balance. Decreased WB through LLE and increased WB through BUEs. + emesis post transfer.  Stairs            Wheelchair Mobility    Modified Rankin (Stroke Patients Only)       Balance Overall balance assessment: Needs assistance Sitting-balance support: Feet supported;No upper extremity supported Sitting balance-Leahy Scale: Good     Standing balance support: During functional activity;Bilateral upper extremity supported Standing balance-Leahy Scale: Poor Standing balance comment: Requires BUE support on RW for balance/safety.                             Pertinent Vitals/Pain Pain Assessment: 0-10 Pain Score:  (not rated on pain scale.) Pain Location: left knee Pain Descriptors / Indicators: Sore Pain Intervention(s): Limited activity within patient's tolerance;Monitored during session;Premedicated before session;Repositioned;Ice applied    Home Living Family/patient expects to be discharged to:: Private residence Living Arrangements: Spouse/significant other;Other relatives Available Help at Discharge: Family Type of Home: House Home Access: Stairs to enter Entrance Stairs-Rails: None Entrance Stairs-Number of Steps: 2 Home Layout: Multi-level Home Equipment: Environmental consultant - 2 wheels;Bedside commode      Prior Function Level of Independence: Independent  Hand Dominance        Extremity/Trunk Assessment   Upper Extremity Assessment: Defer to OT evaluation           Lower Extremity Assessment: LLE  deficits/detail;Generalized weakness   LLE Deficits / Details: Grossly ~2+/5 knee extension, 2/5 hip flexion, 3/5 ankle DF. Limited AROM throughout LLE.     Communication   Communication: No difficulties  Cognition Arousal/Alertness: Lethargic;Suspect due to medications Behavior During Therapy: [facility] for tasks assessed/performed Overall Cognitive Status: Within Functional Limits for tasks assessed                      General Comments General comments (skin integrity, edema, etc.): Education provided on HEP and precautions for knee. Pt very lethargic and feeling "sick" during session.    Exercises Total Joint Exercises Ankle Circles/Pumps: Both;15 reps;Supine Quad Sets: Both;10 reps;Supine (3-5 sec hold)      Assessment/Plan    PT Assessment Patient needs continued PT services  PT Diagnosis Acute pain;Generalized weakness;Difficulty walking   PT Problem List Decreased strength;Pain;Decreased range of motion;Impaired sensation;Decreased activity tolerance;Decreased knowledge of use of DME;Decreased balance;Decreased mobility;Decreased knowledge of precautions  PT Treatment Interventions Balance training;Gait training;Stair training;Therapeutic exercise;Therapeutic activities;Functional mobility training;Patient/family education;DME instruction   PT Goals (Current goals can be found in the Care Plan section) Acute Rehab PT Goals Patient Stated Goal: none stated PT Goal Formulation: With patient Time For Goal Achievement: 07/01/14 Potential to Achieve Goals: Fair    Frequency 7X/week   Barriers to discharge Inaccessible home environment Lots of stairs as pt lives in a split level home.    Co-evaluation               End of Session Equipment Utilized During Treatment: Gait belt;Oxygen Activity Tolerance: Patient limited by lethargy;Other (comment);Patient limited by pain (Nausea/vomiting) Patient left: in bed;with call bell/phone within reach;with bed alarm  set;with family/visitor present           Time: [id number] PT Time Calculation (min) (ACUTE ONLY): 47 min   Charges:   PT Evaluation $Initial PT Evaluation Tier I: 1 Procedure PT Treatments $Therapeutic Activity: 23-37 mins   PT G CodesAlvie Heidelberg:        Folan, Diandre Merica A 06/17/2014, 5:02 PM Alvie HeidelbergShauna Folan, PT, DPT [phone]

## 2014-06-17 NOTE — Progress Notes (Signed)
Spoke w/ Rembert from ortho--he states they are out of CPMs at this time.

## 2014-06-17 NOTE — Transfer of Care (Signed)
Immediate Anesthesia Transfer of Care Note  Patient: Meghan Morales  Procedure(s) Performed: Procedure(s): LEFT TOTAL KNEE ARTHROPLASTY (Left)  Patient Location: PACU  Anesthesia Type:MAC and Spinal  Level of Consciousness: awake, alert , oriented and patient cooperative  Airway & Oxygen Therapy: Patient Spontanous Breathing and Patient connected to nasal cannula oxygen  Post-op Assessment: Report given to PACU RN and Post -op Vital signs reviewed and stable  Post vital signs: Reviewed and stable  Complications: No apparent anesthesia complications

## 2014-06-17 NOTE — Anesthesia Postprocedure Evaluation (Signed)
  Anesthesia Post-op Note  Patient: Meghan NettleMarcia W Hoeg  Procedure(s) Performed: Procedure(s): LEFT TOTAL KNEE ARTHROPLASTY (Left)  Patient Location: PACU  Anesthesia Type:Spinal and MAC combined with regional for post-op pain  Level of Consciousness: awake, alert  and oriented  Airway and Oxygen Therapy: Patient Spontanous Breathing and Patient connected to nasal cannula oxygen  Post-op Pain: mild  Post-op Assessment: Post-op Vital signs reviewed, Patient's Cardiovascular Status Stable, Respiratory Function Stable and Patent Airway  Post-op Vital Signs: stable  Last Vitals:  Filed Vitals:   06/17/14 1430  BP:   Pulse:   Temp: 36.5 C  Resp:     Complications: No apparent anesthesia complications

## 2014-06-17 NOTE — Op Note (Signed)
PREOP DIAGNOSIS: DJD LEFT KNEE POSTOP DIAGNOSIS:  same PROCEDURE: LEFT TKR ANESTHESIA: Spinal and block ATTENDING SURGEON: Adeana Grilliot G ASSISTANTElodia Florence PA  INDICATIONS FOR PROCEDURE: Meghan Morales is a 54 y.o. female who has struggled for a long time with pain due to degenerative arthritis of the left knee.  The patient has failed many conservative non-operative measures and at this point has pain which limits the ability to sleep and walk.  The patient is offered total knee replacement.  Informed operative consent was obtained after discussion of possible risks of anesthesia, infection, neurovascular injury, DVT, and death.  The importance of the post-operative rehabilitation protocol to optimize result was stressed extensively with the patient.  SUMMARY OF FINDINGS AND PROCEDURE:  Meghan Morales was taken to the operative suite where under the above anesthesia a left knee replacement was performed.  There were advanced degenerative changes and the bone quality was good.  We used the DePuyLCS system and placed size large femur, tibia, 38 mm all polyethylene patella, and a size 15 mm spacer.  Meghan Florence PA-C assisted throughout and was invaluable to the completion of the case in that he helped retract and maintain exposure while I placed the components.  He also helped close thereby minimizing OR time.  The patient was admitted for appropriate post-op care to include perioperative antibiotics and mechanical and pharmacologic measures for DVT prophylaxis.  DESCRIPTION OF PROCEDURE:  Meghan Morales was taken to the operative suite where the above anesthesia was applied.  The patient was positioned supine and prepped and draped in normal sterile fashion.  An appropriate time out was performed.  After the administration of Kefzol pre-op antibiotic the leg was elevated and exsanguinated and a tourniquet inflated.  A standard longitudinal incision was made on the anterior knee.   Dissection was carried down to the extensor mechanism.  All appropriate anti-infective measures were used including the pre-operative antibiotic, betadine impregnated drape, and closed hooded exhaust systems for each member of the surgical team.  A medial parapatellar incision was made in the extensor mechanism and the knee cap flipped and the knee flexed.  Some residual meniscal tissues were removed along with any remaining ACL/PCL tissue.  A guide was placed on the tibia and a flat cut was made on it's superior surface.  An intramedullary guide was placed in the femur and was utilized to make anterior and posterior cuts creating an appropriate flexion gap.  A second intramedullary guide was placed in the femur to make a distal cut properly balancing the knee with an extension gap equal to the flexion gap.  The three bones sized to the above mentioned sizes and the appropriate guides were placed and utilized.  A trial reduction was done and the knee easily came to full extension and the patella tracked well on flexion.  The trial components were removed and all bones were cleaned with pulsatile lavage and then dried thoroughly.  Cement was mixed and was pressurized onto the bones followed by placement of the aforementioned components.  Excess cement was trimmed and pressure was held on the components until the cement had hardened.  The tourniquet was deflated and a small amount of bleeding was controlled with cautery and pressure.  The knee was irrigated thoroughly.  The extensor mechanism was re-approximated with V-loc suture in running fashion.  The knee was flexed and the repair was solid.  The subcutaneous tissues were re-approximated with #0 and #2-0 vicryl and the skin closed  with a subcuticular stitch and steristrips.  A sterile dressing was applied.  Intraoperative fluids, EBL, and tourniquet time can be obtained from anesthesia records.  DISPOSITION:  The patient was taken to recovery room in stable  condition and admitted for appropriate post-op care to include peri-operative antibiotic and DVT prophylaxis with mechanical and pharmacologic measures.  Jerron Niblack G 06/17/2014, 9:22 AM

## 2014-06-17 NOTE — Interval H&P Note (Signed)
OK for surgery PD 

## 2014-06-17 NOTE — Progress Notes (Signed)
Report to Emily RN for lunch relief.

## 2014-06-18 LAB — CBC
HEMATOCRIT: 33.2 % — AB (ref 36.0–46.0)
Hemoglobin: 10.8 g/dL — ABNORMAL LOW (ref 12.0–15.0)
MCH: 25.8 pg — ABNORMAL LOW (ref 26.0–34.0)
MCHC: 32.5 g/dL (ref 30.0–36.0)
MCV: 79.4 fL (ref 78.0–100.0)
PLATELETS: 301 10*3/uL (ref 150–400)
RBC: 4.18 MIL/uL (ref 3.87–5.11)
RDW: 14 % (ref 11.5–15.5)
WBC: 8 10*3/uL (ref 4.0–10.5)

## 2014-06-18 LAB — BASIC METABOLIC PANEL
ANION GAP: 10 (ref 5–15)
BUN: 8 mg/dL (ref 6–23)
CALCIUM: 8.4 mg/dL (ref 8.4–10.5)
CO2: 27 mmol/L (ref 19–32)
Chloride: 100 mEq/L (ref 96–112)
Creatinine, Ser: 0.71 mg/dL (ref 0.50–1.10)
GFR calc Af Amer: >90 mL/min (ref >90)
GFR calc non Af Amer: >90 mL/min (ref >90)
GLUCOSE: 120 mg/dL — AB (ref 70–99)
POTASSIUM: 3 mmol/L — AB (ref 3.5–5.1)
SODIUM: 137 mmol/L (ref 135–145)

## 2014-06-18 LAB — URINALYSIS, ROUTINE W REFLEX MICROSCOPIC
Bilirubin Urine: NEGATIVE
GLUCOSE, UA: NEGATIVE mg/dL
KETONES UR: NEGATIVE mg/dL
Leukocytes, UA: NEGATIVE
Nitrite: NEGATIVE
Protein, ur: NEGATIVE mg/dL
Specific Gravity, Urine: <1.005 — ABNORMAL LOW (ref 1.005–1.030)
Urobilinogen, UA: 0.2 mg/dL (ref 0.0–1.0)
pH: 6.5 (ref 5.0–8.0)

## 2014-06-18 LAB — GLUCOSE, CAPILLARY
GLUCOSE-CAPILLARY: 128 mg/dL — AB (ref 70–99)
GLUCOSE-CAPILLARY: 130 mg/dL — AB (ref 70–99)
Glucose-Capillary: 119 mg/dL — ABNORMAL HIGH (ref 70–99)
Glucose-Capillary: 138 mg/dL — ABNORMAL HIGH (ref 70–99)

## 2014-06-18 LAB — URINE MICROSCOPIC-ADD ON

## 2014-06-18 NOTE — Progress Notes (Signed)
Physical Therapy Treatment Patient Details Name: Meghan NettleMarcia W Hedges MRN: [id number] DOB: 03/11/1961 Today's Date: 06/18/2014    History of Present Illness Patient is a 54 y/o female s/p L TKA. PMH of DM, and HTN.    PT Comments    Patient progressing well with mobility. Improved ambulation distance today with multiple short standing rest breaks due to fatigue and pain. Pain seems more controlled today allowing for activity tolerance. Reviewed HEP. Instructed pt on how to use spirometer. Pt would benefit from skilled PT to improve transfers, gait,balance and mobility so pt can maximize independence and return to PLOF. Will plan for stair training in PM session as tolerated.   Follow Up Recommendations  Home health PT;Supervision - Intermittent     Equipment Recommendations  None recommended by PT    Recommendations for Other Services       Precautions / Restrictions Precautions Precautions: Knee Precaution Booklet Issued: No Precaution Comments: Reviewed precautions. Required Braces or Orthoses: Knee Immobilizer - Left Knee Immobilizer - Left: On when out of bed or walking Restrictions Weight Bearing Restrictions: Yes LLE Weight Bearing: Weight bearing as tolerated    Mobility  Bed Mobility Overal bed mobility: Needs Assistance Bed Mobility: Supine to Sit;Sit to Supine     Supine to sit: Min assist Sit to supine: Min assist   General bed mobility comments: Received sitting in chair upon PT arrival.  Transfers Overall transfer level: Needs assistance Equipment used: Rolling walker (2 wheeled) Transfers: Sit to/from Stand Sit to Stand: Min guard         General transfer comment: Min guard to stand from chair x1, from toilet x1. Cues for hand placement.   Ambulation/Gait Ambulation/Gait assistance: Min guard Ambulation Distance (Feet): 140 Feet Assistive device: Rolling walker (2 wheeled) Gait Pattern/deviations: Step-to pattern;Decreased stride length;Decreased  stance time - left;Decreased step length - right;Trunk flexed     General Gait Details: Cues for RW management. Multiple short rest breaks due to fatigue/pain. HR elevated to 137 bpm. Cues to facilitate step through gait.   Stairs            Wheelchair Mobility    Modified Rankin (Stroke Patients Only)       Balance Overall balance assessment: Needs assistance Sitting-balance support: Feet supported;No upper extremity supported Sitting balance-Leahy Scale: Good Sitting balance - Comments: Able to reach outside BoS to grab toilet paper and for pericare without LOB or difficulty.    Standing balance support: During functional activity Standing balance-Leahy Scale: Poor                      Cognition Arousal/Alertness: Awake/alert Behavior During Therapy: WFL for tasks assessed/performed Overall Cognitive Status: Within Functional Limits for tasks assessed                      Exercises Total Joint Exercises Ankle Circles/Pumps: Both;15 reps;Seated Quad Sets: Both;10 reps;Supine (long sitting in chair, 3-5 sec hold.) Heel Slides: Left;Seated (30-45 sec hold at end range x7.) Goniometric ROM: ~65 degrees knee flexion actively. Lacking ~9 degrees knee extension.    General Comments General comments (skin integrity, edema, etc.): Pt concerned that she was not given a spirometer or ted hose yesterday. RN notified and made aware. Instructed pt on how to correctly use spirometer.      Pertinent Vitals/Pain Pain Assessment: 0-10 Pain Score: 5  Pain Location: left knee with mobility Pain Descriptors / Indicators: Sore;Aching Pain Intervention(s): Monitored during session;RN gave  pain meds during session;Repositioned    Home Living Family/patient expects to be discharged to:: Private residence Living Arrangements: Spouse/significant other;Other relatives Available Help at Discharge: Family Type of Home: House Home Access: Stairs to enter Entrance  Stairs-Rails: None Home Layout: Multi-level Home Equipment: Environmental consultant - 2 wheels;Bedside commode      Prior Function Level of Independence: Independent          PT Goals (current goals can now be found in the care plan section) Progress towards PT goals: Progressing toward goals    Frequency  7X/week (BID)    PT Plan Current plan remains appropriate    Co-evaluation             End of Session Equipment Utilized During Treatment: Gait belt Activity Tolerance: Patient tolerated treatment well;Patient limited by pain Patient left: in chair;with call bell/phone within reach;with family/visitor present     Time: 1610-9604 PT Time Calculation (min) (ACUTE ONLY): 38 min  Charges:  $Gait Training: 23-37 mins $Therapeutic Exercise: 8-22 mins                    G CodesAlvie Heidelberg A 06-29-2014, 12:52 PM Alvie Heidelberg, PT, DPT [phone]

## 2014-06-18 NOTE — Progress Notes (Signed)
Subjective: 1 Day Post-Op Procedure(s) (LRB): LEFT TOTAL KNEE ARTHROPLASTY (Left)  Activity level:  wbat Diet tolerance:  ok Voiding:  ok Patient reports pain as mild and moderate.    Objective: Vital signs in last 24 hours: Temp:  [97.5 F (36.4 C)-98.9 F (37.2 C)] 98.7 F (37.1 C) (01/06 0559) Pulse Rate:  [58-107] 106 (01/06 0559) Resp:  [4-19] 14 (01/06 0559) BP: (107-167)/(45-103) 128/45 mmHg (01/06 0559) SpO2:  [98 %-100 %] 98 % (01/06 0559) FiO2 (%):  [28 %] 28 % (01/05 1510)  Labs:  Recent Labs  06/18/14 0618  HGB 10.8*    Recent Labs  06/18/14 0618  WBC 8.0  RBC 4.18  HCT 33.2*  PLT 301    Recent Labs  06/18/14 0618  NA 137  K 3.0*  CL 100  CO2 27  BUN 8  CREATININE 0.71  GLUCOSE 120*  CALCIUM 8.4   No results for input(s): LABPT, INR in the last 72 hours.  Physical Exam:  Neurologically intact ABD soft Neurovascular intact Sensation intact distally Intact pulses distally Dorsiflexion/Plantar flexion intact Incision: dressing C/D/I and no drainage No cellulitis present Compartment soft  Assessment/Plan:  1 Day Post-Op Procedure(s) (LRB): LEFT TOTAL KNEE ARTHROPLASTY (Left) Advance diet Up with therapy D/C IV fluids Plan for discharge tomorrow Discharge home with home health if doing well and cleared by PT Continue on ASA 325mg  BID x 2 weeks post op. Follow up in 2 weeks post op. Dressing change to aquacel prior to discharge.    Breaunna Gottlieb, Ginger OrganNDREW PAUL 06/18/2014, 8:13 AM

## 2014-06-18 NOTE — Evaluation (Addendum)
Occupational Therapy Evaluation Patient Details Name: Meghan Morales MRN: [id number] DOB: 08/19/1960 Today's Date: 06/18/2014    History of Present Illness Patient is a 54 y/o female s/p L TKA. PMH of DM, and HTN.   Clinical Impression   Pt s/p above. Education provided to pt and pt verbalized understanding. OT signing off.    Follow Up Recommendations  No OT follow up;Supervision - Intermittent    Equipment Recommendations  None recommended by OT    Recommendations for Other Services       Precautions / Restrictions Precautions Precautions: Knee;Fall Precaution Booklet Issued: No Precaution Comments: educated on precautions Required Braces or Orthoses: Knee Immobilizer - Left Knee Immobilizer - Left: On when out of bed or walking Restrictions Weight Bearing Restrictions: Yes LLE Weight Bearing: Weight bearing as tolerated      Mobility Bed Mobility Overal bed mobility: Needs Assistance Bed Mobility: Supine to Sit;Sit to Supine     Supine to sit: Min assist Sit to supine: Min assist   General bed mobility comments: assist with LLE. Educated that pt could use blanket to assist LE or could hook other leg around ankle to assist.  Transfers Overall transfer level: Needs assistance Equipment used: Rolling walker (2 wheeled) Transfers: Sit to/from Stand Sit to Stand: Min assist         General transfer comment: Min A for sit to stand from bed. Min guard for stand to sit to bed.    Balance                                            ADL Overall ADL's : Needs assistance/impaired     Grooming: Wash/dry face;Oral care;Set up;Supervision/safety;Standing               Lower Body Dressing: Min guard;Sit to/from stand   Toilet Transfer: Min guard;Ambulation;RW (bed)           Functional mobility during ADLs: Min guard;Rolling walker-cues for technique General ADL Comments: Educated on LB dressing technique and benefit of reaching down  to left sock as it allows knee to bend. Educated on knee immobilizer and to have on when standing. Educated on safety tips such as use of bag on walker, rugs/items on floor, safe shoewear, and pets. Pt asking about chairs/couch to sit on at home-discussed. Pt planning to sponge bathe. Educated on AE and explained could be used if LB ADLs become an issue.     Vision                     Perception     Praxis      Pertinent Vitals/Pain Pain Assessment: 0-10 Pain Score: 3  Pain Location: left knee Pain Intervention(s): Monitored during session;Repositioned     Hand Dominance     Extremity/Trunk Assessment Upper Extremity Assessment Upper Extremity Assessment: Overall WFL for tasks assessed   Lower Extremity Assessment Lower Extremity Assessment: Defer to PT evaluation       Communication Communication Communication: No difficulties   Cognition Arousal/Alertness: Awake/alert Behavior During Therapy: WFL for tasks assessed/performed Overall Cognitive Status: Within Functional Limits for tasks assessed                     General Comments       Exercises       Shoulder Instructions  Home Living Family/patient expects to be discharged to:: Private residence Living Arrangements: Spouse/significant other;Other relatives Available Help at Discharge: Family Type of Home: House Home Access: Stairs to enter Secretary/administratorntrance Stairs-Number of Steps: 2 Entrance Stairs-Rails: None Home Layout: Multi-level Alternate Level Stairs-Number of Steps: split level home, 6 steps for downstairs, 12 steps to get to upstairs Alternate Level Stairs-Rails: Right Bathroom Shower/Tub: Walk-in shower;Tub/shower unit   Bathroom Toilet: Handicapped height     Home Equipment: Environmental consultantWalker - 2 wheels;Bedside commode          Prior Functioning/Environment Level of Independence: Independent             OT Diagnosis: Acute pain   OT Problem List:     OT  Treatment/Interventions:      OT Goals(Current goals can be found in the care plan section)    OT Frequency:     Barriers to D/C:            Co-evaluation              End of Session Equipment Utilized During Treatment: Gait belt;Rolling walker;Left knee immobilizer CPM Left Knee CPM Left Knee: Off Nurse Communication: Other (comment) (asked tech about getting chair and vaseline; mobility status)  Activity Tolerance: Patient tolerated treatment well Patient left: in bed;with call bell/phone within reach   Time: [phone] OT Time Calculation (min): 28 min Charges:  OT General Charges $OT Visit: 1 Procedure OT Evaluation $Initial OT Evaluation Tier I: 1 Procedure OT Treatments $Self Care/Home Management : 8-22 mins G-CodesEarlie Raveling:    Mersades Barbaro L OTR/L [phone] 06/18/2014, 9:35 AM

## 2014-06-18 NOTE — Progress Notes (Signed)
Physical Therapy Treatment Patient Details Name: Meghan Morales MRN: [id number] DOB: Oct 01, 1960 Today's Date: 06/18/2014    History of Present Illness Patient is a 54 y/o female s/p L TKA. PMH of DM, and HTN.    PT Comments    Patient progressing well with mobility. Tolerated stair negotiation with Min A for balance/safety. Will need to perform family training on stairs tomorrow prior to discharge. Improved ambulation distance this afternoon. Reviewed HEP. Pt able to increase knee flexion AROM this PM. Will continue to follow acutely.   Follow Up Recommendations  Home health PT;Supervision - Intermittent     Equipment Recommendations  None recommended by PT    Recommendations for Other Services       Precautions / Restrictions Precautions Precautions: Knee Precaution Booklet Issued: No Precaution Comments: Reviewed precautions. Required Braces or Orthoses: Knee Immobilizer - Left Knee Immobilizer - Left: On when out of bed or walking Restrictions Weight Bearing Restrictions: Yes LLE Weight Bearing: Weight bearing as tolerated    Mobility  Bed Mobility Overal bed mobility: Needs Assistance Bed Mobility: Sit to Supine       Sit to supine: Min assist   General bed mobility comments: Min A to bring LLE into bed. HOB flat, no use of rails to simulate home environment.  Transfers Overall transfer level: Needs assistance Equipment used: Rolling walker (2 wheeled) Transfers: Sit to/from Stand Sit to Stand: Min guard         General transfer comment: Stood from chair x2, from toilet x1. Min guard for safety. Cues for hand placement.  Ambulation/Gait Ambulation/Gait assistance: Supervision Ambulation Distance (Feet): 175 Feet Assistive device: Rolling walker (2 wheeled) Gait Pattern/deviations: Step-to pattern;Decreased stride length;Decreased stance time - left;Decreased step length - right;Trunk flexed   Gait velocity interpretation: Below normal speed for  age/gender General Gait Details: Pt with slow, steady gait. Cues to facilitate step through gait and RW management.   Stairs Stairs: Yes Stairs assistance: Min assist Stair Management: Two rails;Step to pattern Number of Stairs: 2 (x2 bouts) General stair comments: Min A for ascent. Cues for technique. Will perform family training next session.  Wheelchair Mobility    Modified Rankin (Stroke Patients Only)       Balance Overall balance assessment: Needs assistance Sitting-balance support: Feet supported;No upper extremity supported Sitting balance-Leahy Scale: Good Sitting balance - Comments: Able to reach outside BoS to grab toilet paper and for pericare without LOB or difficulty.    Standing balance support: During functional activity Standing balance-Leahy Scale: Fair Standing balance comment: Tolerated performing peri care in standing reaching outside BoS without difficulty, requires BUE support for gait.                    Cognition Arousal/Alertness: Awake/alert Behavior During Therapy: WFL for tasks assessed/performed Overall Cognitive Status: Within Functional Limits for tasks assessed                      Exercises Total Joint Exercises Ankle Circles/Pumps: Both;15 reps;Seated Quad Sets: Both;10 reps;Supine (long sitting in chair, 3-5 sec hold.) Heel Slides: Left;5 reps;Seated (30-45 sec hold.) Goniometric ROM: ~80 degrees active knee flexion    General Comments General comments (skin integrity, edema, etc.): Pt concerned that she was not given a spirometer or ted hose yesterday. RN notified and made aware. Instructed pt on how to correctly use spirometer.      Pertinent Vitals/Pain Pain Assessment: 0-10 Pain Score: 3  Pain Location: left knee Pain  Descriptors / Indicators: Sore Pain Intervention(s): Monitored during session;Repositioned    Home Living                      Prior Function            PT Goals (current goals  can now be found in the care plan section) Progress towards PT goals: Progressing toward goals    Frequency  7X/week    PT Plan Current plan remains appropriate    Co-evaluation             End of Session Equipment Utilized During Treatment: Gait belt Activity Tolerance: Patient tolerated treatment well Patient left: in bed;with call bell/phone within reach;with bed alarm set;with family/visitor present     Time: [id number] PT Time Calculation (min) (ACUTE ONLY): 50 min  Charges:  $Gait Training: 23-37 mins $Therapeutic Exercise: 8-22 mins                    G CodesAlvie Heidelberg:      Folan, Jolicia Delira A 06/18/2014, 4:18 PM Alvie HeidelbergShauna Folan, PT, DPT [phone]

## 2014-06-18 NOTE — Progress Notes (Signed)
Utilization review complete. Annlouise Gerety RN CCM Case Mgmt phone [phone] 

## 2014-06-18 NOTE — Progress Notes (Signed)
CARE MANAGEMENT NOTE 06/18/2014  Morales:  Meghan Morales,Meghan Morales   Account Number:  [id number]  Date Initiated:  06/18/2014  Documentation initiated by:  [facility]  Subjective/Objective Assessment:   left total knee     Action/Plan:   [facility]   Anticipated DC Date:  06/20/2014   Anticipated DC Plan:  [facility]      DC Planning Services  CM consult      [facility]   Choice offered to / List presented to:  Meghan Morales   DME arranged  CPM  3-N-1  WALKER - ROLLING      DME agency  [employer]     HH arranged  HH-2 PT      HH agency  [facility]   Status of service:  Completed, signed off Medicare Important Message given?  NA - LOS <3 / Initial given by admissions (If response is "NO", the following Medicare IM given date fields will be blank) Date Medicare IM given:   Medicare IM given by:   Date Additional Medicare IM given:   Additional Medicare IM given by:    Discharge Disposition:  [facility]  Per UR Regulation:    If discussed at Long Length of Stay Meetings, dates discussed:    Comments:  06/18/2014 1130 NCM spoke to pt and [facility]. Pt has CPM, RW and 3n1 at home. Meghan DonningAlesia Bianca Raneri RN CCM Case Mgmt phone [phone]

## 2014-06-19 ENCOUNTER — Ambulatory Visit: Payer: 59 | Admitting: Dietician

## 2014-06-19 ENCOUNTER — Encounter (HOSPITAL_COMMUNITY): Payer: Self-pay | Admitting: Orthopaedic Surgery

## 2014-06-19 LAB — CBC
HCT: 32.6 % — ABNORMAL LOW (ref 36.0–46.0)
Hemoglobin: 11 g/dL — ABNORMAL LOW (ref 12.0–15.0)
MCH: 26.4 pg (ref 26.0–34.0)
MCHC: 33.7 g/dL (ref 30.0–36.0)
MCV: 78.4 fL (ref 78.0–100.0)
PLATELETS: 302 10*3/uL (ref 150–400)
RBC: 4.16 MIL/uL (ref 3.87–5.11)
RDW: 13.9 % (ref 11.5–15.5)
WBC: 11.1 10*3/uL — ABNORMAL HIGH (ref 4.0–10.5)

## 2014-06-19 LAB — GLUCOSE, CAPILLARY
GLUCOSE-CAPILLARY: 128 mg/dL — AB (ref 70–99)
Glucose-Capillary: 159 mg/dL — ABNORMAL HIGH (ref 70–99)

## 2014-06-19 MED ORDER — HYDROCODONE-ACETAMINOPHEN 5-325 MG PO TABS: 1.0000 | ORAL_TABLET | ORAL | Status: DC | PRN

## 2014-06-19 MED ORDER — ASPIRIN 325 MG PO TBEC: 325.0000 mg | DELAYED_RELEASE_TABLET | Freq: Two times a day (BID) | ORAL | Status: DC

## 2014-06-19 MED ORDER — METHOCARBAMOL 500 MG PO TABS: 500.0000 mg | ORAL_TABLET | Freq: Four times a day (QID) | ORAL | Status: DC | PRN

## 2014-06-19 NOTE — Progress Notes (Signed)
Physical Therapy Treatment Patient Details Name: Meghan Morales MRN: [id number] DOB: 1960-09-15 Today's Date: 06/19/2014    History of Present Illness Patient is a 54 y/o female s/p L TKA. PMH of DM, and HTN.    PT Comments    Family educated on proper technique for (A) pt with stair management. Reviewed car transfer technique and HEP. Pt limited in gt this session secondary to pain with exercises.   Follow Up Recommendations  Home health PT;Supervision - Intermittent     Equipment Recommendations  None recommended by PT    Recommendations for Other Services       Precautions / Restrictions Precautions Precautions: Knee Precaution Comments: reviewed no pillow under knee Required Braces or Orthoses: Knee Immobilizer - Left Knee Immobilizer - Left: On when out of bed or walking Restrictions Weight Bearing Restrictions: Yes LLE Weight Bearing: Weight bearing as tolerated    Mobility  Bed Mobility Overal bed mobility: Needs Assistance Bed Mobility: Supine to Sit     Supine to sit: Supervision     General bed mobility comments: cues for technique and use of sheet to (A) Lt LE off EOB  Transfers Overall transfer level: Needs assistance Equipment used: Rolling walker (2 wheeled) Transfers: Sit to/from Stand Sit to Stand: Supervision         General transfer comment: supervision for safety and min cues for proper hand placement  Ambulation/Gait Ambulation/Gait assistance: Supervision Ambulation Distance (Feet): 60 Feet Assistive device: Rolling walker (2 wheeled) Gait Pattern/deviations: Step-to pattern;Decreased stance time - left;Decreased step length - right;Antalgic Gait velocity: decreased Gait velocity interpretation: Below normal speed for age/gender General Gait Details: limited this sesison due to pain; multimodal cues for step through gt and upright posture    Stairs Stairs: Yes Stairs assistance: Min guard Stair Management: No rails;Step to  pattern;With walker;Backwards Number of Stairs: 4 General stair comments: reviewed technique with pt; pt performed teachback technique to family; family able to (A) pt with min cueing   Wheelchair Mobility    Modified Rankin (Stroke Patients Only)       Balance Overall balance assessment: Needs assistance Sitting-balance support: Feet supported;No upper extremity supported Sitting balance-Leahy Scale: Good     Standing balance support: During functional activity;Bilateral upper extremity supported Standing balance-Leahy Scale: Poor Standing balance comment: RW to balance                     Cognition Arousal/Alertness: Awake/alert Behavior During Therapy: WFL for tasks assessed/performed Overall Cognitive Status: Within Functional Limits for tasks assessed                      Exercises Total Joint Exercises Ankle Circles/Pumps: AROM;Both;10 reps;Seated Quad Sets: AROM;10 reps;Seated Hip ABduction/ADduction: AAROM;Left;10 reps;Seated Long Arc Quad: AROM;Left;10 reps;Seated    General Comments General comments (skin integrity, edema, etc.): reviewed car transfer technique and HEP handout       Pertinent Vitals/Pain Pain Assessment: 0-10 Pain Score: 8  Pain Location: Lt knee Pain Descriptors / Indicators: Sore Pain Intervention(s): Premedicated before session;Monitored during session;Repositioned    Home Living                      Prior Function            PT Goals (current goals can now be found in the care plan section) Acute Rehab PT Goals Patient Stated Goal: home after this PT Goal Formulation: With patient Time For Goal Achievement: 07/01/14  Potential to Achieve Goals: Fair Progress towards PT goals: Progressing toward goals    Frequency  7X/week    PT Plan Current plan remains appropriate    Co-evaluation             End of Session Equipment Utilized During Treatment: Gait belt;Left knee immobilizer Activity  Tolerance: Patient tolerated treatment well Patient left: in chair;with call bell/phone within reach;with family/visitor present     Time: [id number] PT Time Calculation (min) (ACUTE ONLY): 28 min  Charges:  $Gait Training: 8-22 mins $Therapeutic Exercise: 8-22 mins                    G CodesDonell Sievert:      Arriel Victor N, [place]PT  [phone] 06/19/2014, 4:42 PM

## 2014-06-19 NOTE — Discharge Summary (Signed)
Patient ID: Meghan Morales MRN: [id number] DOB/AGE: 11/05/1960 54 y.o.  Admit date: 06/17/2014 Discharge date: 06/19/2014  Admission Diagnoses:  Principal Problem:   Primary osteoarthritis of left knee Active Problems:   Obesity   Diabetes   Primary osteoarthritis of knee   Discharge Diagnoses:  Same  Past Medical History  Diagnosis Date  . Diabetes mellitus without complication   . Hypertension   . Hypothyroidism   . Arthritis     knees & back arithrits   . Urgency of urination     Surgeries: Procedure(s): LEFT TOTAL KNEE ARTHROPLASTY on 06/17/2014   Consultants:    Discharged Condition: Improved  Hospital Course: Meghan Morales is an 54 y.o. female who was admitted 06/17/2014 for operative treatment ofPrimary osteoarthritis of left knee. Patient has severe unremitting pain that affects sleep, daily activities, and work/hobbies. After pre-op clearance the patient was taken to the operating room on 06/17/2014 and underwent  Procedure(s): LEFT TOTAL KNEE ARTHROPLASTY.    Patient was given perioperative antibiotics: Anti-infectives    Start     Dose/Rate Route Frequency Ordered Stop   06/17/14 1515  ceFAZolin (ANCEF) IVPB 2 g/50 mL premix     2 g100 mL/hr over 30 Minutes Intravenous Every 6 hours 06/17/14 1509 06/17/14 2240       Patient was given sequential compression devices, early ambulation, and chemoprophylaxis to prevent DVT.  Patient benefited maximally from hospital stay and there were no complications.    Recent vital signs: Patient Vitals for the past 24 hrs:  BP Temp Temp src Pulse Resp SpO2  06/19/14 1142 (!) 136/57 mmHg 98.2 F (36.8 C) - 96 16 96 %  06/19/14 0532 (!) 150/64 mmHg 100 F (37.8 C) - (!) 101 16 94 %  06/18/14 2019 (!) 160/62 mmHg 98.7 F (37.1 C) Oral (!) 107 17 98 %     Recent laboratory studies:  Recent Labs  06/18/14 0618 06/19/14 0820  WBC 8.0 11.1*  HGB 10.8* 11.0*  HCT 33.2* 32.6*  PLT 301 302  NA 137  --   K 3.0*  --    CL 100  --   CO2 27  --   BUN 8  --   CREATININE 0.71  --   GLUCOSE 120*  --   CALCIUM 8.4  --      Discharge Medications:     Medication List    STOP taking these medications        diclofenac 75 MG EC tablet  Commonly known as:  VOLTAREN      TAKE these medications        acetaminophen 650 MG CR tablet  Commonly known as:  TYLENOL  Take 1,300 mg by mouth every 8 (eight) hours as needed for pain.     aspirin 325 MG EC tablet  Take 1 tablet (325 mg total) by mouth 2 (two) times daily after a meal.     diltiazem 120 MG 24 hr capsule  Commonly known as:  DILACOR XR  Take 120 mg by mouth daily.     HYDROcodone-acetaminophen 5-325 MG per tablet  Commonly known as:  NORCO/VICODIN  Take 1-2 tablets by mouth every 4 (four) hours as needed (breakthrough pain).     levothyroxine 175 MCG tablet  Commonly known as:  SYNTHROID, LEVOTHROID  Take 175 mcg by mouth daily before breakfast.     metFORMIN 500 MG tablet  Commonly known as:  GLUCOPHAGE  Take 500 mg by mouth daily with breakfast.  methocarbamol 500 MG tablet  Commonly known as:  ROBAXIN  Take 1 tablet (500 mg total) by mouth every 6 (six) hours as needed for muscle spasms.     telmisartan-hydrochlorothiazide 80-25 MG per tablet  Commonly known as:  MICARDIS HCT  Take 1 tablet by mouth daily.     tolterodine 4 MG 24 hr capsule  Commonly known as:  DETROL LA  Take 4 mg by mouth daily.        Diagnostic Studies: Dg Chest 2 View  06/09/2014   CLINICAL DATA:  Preop testing, left total knee arthroplasty.  EXAM: CHEST  2 VIEW  COMPARISON:  CT chest and chest radiograph 05/23/2007.  FINDINGS: Trachea is midline. Heart size normal. Lungs are clear. No pleural fluid. Degenerative changes are seen in the acromioclavicular joints.  IMPRESSION: No acute findings.   Electronically Signed   By: Leanna BattlesMelinda  Blietz M.D.   On: 06/09/2014 09:51    Disposition:       Discharge Instructions    Call MD / Call 911     Complete by:  As directed   If you experience chest pain or shortness of breath, CALL 911 and be transported to the hospital emergency room.  If you develope a fever above 101 F, pus (white drainage) or increased drainage or redness at the wound, or calf pain, call your surgeon's office.     Constipation Prevention    Complete by:  As directed   Drink plenty of fluids.  Prune juice may be helpful.  You may use a stool softener, such as Colace (over the counter) 100 mg twice a day.  Use MiraLax (over the counter) for constipation as needed.     Diet - low sodium heart healthy    Complete by:  As directed      Increase activity slowly as tolerated    Complete by:  As directed            Follow-up Information    Follow up with Velna OchsALLDORF,PETER G, MD. Call in 2 weeks.   Specialty:  Orthopedic Surgery   Contact information:   [address] [place] [address] [phone]       Follow up with [facility].   Why:  Home Health Physical Therapy    Contact information:   [address] [place] [address] [phone]        Signed: Drema HalonIDA, Mical Kicklighter PAUL 06/19/2014, 2:49 PM

## 2014-06-19 NOTE — Progress Notes (Signed)
Physical Therapy Treatment Patient Details Name: Meghan Morales MRN: [id number] DOB: 10/07/1960 Today's Date: 06/19/2014    History of Present Illness Patient is a 54 y/o female s/p L TKA. PMH of DM, and HTN.    PT Comments    Session focused on reinforcing stair management. Will benefit from additional session this afternoon to educate family on stair management technique. Reviewed HEP and car transfer technique with pt.   Follow Up Recommendations  Home health PT;Supervision - Intermittent     Equipment Recommendations  None recommended by PT    Recommendations for Other Services       Precautions / Restrictions Precautions Precautions: Knee Precaution Comments: reviewed no pillow under knee Required Braces or Orthoses: Knee Immobilizer - Left Knee Immobilizer - Left: On when out of bed or walking Restrictions Weight Bearing Restrictions: Yes LLE Weight Bearing: Weight bearing as tolerated    Mobility  Bed Mobility               General bed mobility comments: up in chair and returned to chair  Transfers Overall transfer level: Needs assistance Equipment used: Rolling walker (2 wheeled) Transfers: Sit to/from Stand Sit to Stand: Supervision         General transfer comment: cues for hand placement and to control descent to chair  Ambulation/Gait Ambulation/Gait assistance: Supervision Ambulation Distance (Feet): 150 Feet Assistive device: Rolling walker (2 wheeled) Gait Pattern/deviations: Step-to pattern;Decreased step length - right;Decreased stance time - left;Antalgic Gait velocity: decreased Gait velocity interpretation: Below normal speed for age/gender General Gait Details: cues for step through gt sequencing and upright posture; limited distance due to fatigue    Stairs Stairs: Yes Stairs assistance: Min guard Stair Management: No rails;Step to pattern;Backwards;With walker Number of Stairs: 2 General stair comments: educated on technique  without handrails for home enviroment setup; will plan to practice again this afternoon with family   Wheelchair Mobility    Modified Rankin (Stroke Patients Only)       Balance Overall balance assessment: Needs assistance Sitting-balance support: Feet supported;No upper extremity supported Sitting balance-Leahy Scale: Good     Standing balance support: During functional activity;Bilateral upper extremity supported Standing balance-Leahy Scale: Poor Standing balance comment: RW to balance                     Cognition Arousal/Alertness: Awake/alert Behavior During Therapy: WFL for tasks assessed/performed Overall Cognitive Status: Within Functional Limits for tasks assessed                      Exercises Total Joint Exercises Ankle Circles/Pumps: AROM;Both;10 reps;Seated Quad Sets: AROM;10 reps;Seated Hip ABduction/ADduction: AAROM;Left;10 reps;Seated Long Arc Quad: AROM;Left;10 reps;Seated Goniometric ROM: AROM in sitting flexion to 90 degrees    General Comments General comments (skin integrity, edema, etc.): reviewed HEP and car transfer technique       Pertinent Vitals/Pain Pain Assessment: 0-10 Pain Score: 6  Pain Location: Lt knee Pain Descriptors / Indicators: Sore Pain Intervention(s): Monitored during session;Premedicated before session;Repositioned    Home Living                      Prior Function            PT Goals (current goals can now be found in the care plan section) Acute Rehab PT Goals Patient Stated Goal: to go home today PT Goal Formulation: With patient Time For Goal Achievement: 07/01/14 Potential to Achieve Goals: Fair Progress  towards PT goals: Progressing toward goals    Frequency  7X/week    PT Plan Current plan remains appropriate    Co-evaluation             End of Session Equipment Utilized During Treatment: Gait belt Activity Tolerance: Patient tolerated treatment well Patient left: in  chair;with call bell/phone within reach;with nursing/sitter in room     Time: 0913-0937 PT Time Calculation (min) (ACUTE ONLY): 24 min  Charges:  $Gait Training: 8-22 mins $Therapeutic Exercise: 8-22 mins                    G CodesDonell Sievert, [place]  [phone] 06/19/2014, 11:15 AM

## 2014-09-09 ENCOUNTER — Other Ambulatory Visit: Payer: Self-pay | Admitting: Endocrinology

## 2014-09-09 DIAGNOSIS — E041 Nontoxic single thyroid nodule: Secondary | ICD-10-CM

## 2015-01-21 ENCOUNTER — Ambulatory Visit (INDEPENDENT_AMBULATORY_CARE_PROVIDER_SITE_OTHER): Payer: 59

## 2015-01-21 ENCOUNTER — Encounter: Payer: Self-pay | Admitting: Podiatry

## 2015-01-21 ENCOUNTER — Ambulatory Visit (INDEPENDENT_AMBULATORY_CARE_PROVIDER_SITE_OTHER): Payer: 59 | Admitting: Podiatry

## 2015-01-21 VITALS — BP 117/65 | HR 82 | Resp 16

## 2015-01-21 DIAGNOSIS — E119 Type 2 diabetes mellitus without complications: Secondary | ICD-10-CM

## 2015-01-21 DIAGNOSIS — M21611 Bunion of right foot: Secondary | ICD-10-CM

## 2015-01-21 DIAGNOSIS — M779 Enthesopathy, unspecified: Secondary | ICD-10-CM

## 2015-01-21 DIAGNOSIS — M2011 Hallux valgus (acquired), right foot: Secondary | ICD-10-CM

## 2015-01-21 NOTE — Progress Notes (Signed)
Subjective:     Patient ID: Meghan Morales, female   DOB: 02/24/1961, 54 y.o.   MRN: [id number]  HPI patient states that she has had a knee replacement and has problems with her feet and legs and is hoping that orthotics could be of benefit   Review of Systems     Objective:   Physical Exam Neurovascular status intact muscle strength adequate with patient having a limb length discrepancy with the right leg being a quarter inch shorter than the left and is noted to have quite a bit of pain in the left knee and the feet at times    Assessment:     Tendinitis with structural issues as a part of the pathological process    Plan:     Reviewed condition and at this time scanned for custom orthotics to try to reduce stress against her feet. I did add lift of a quarter inch to the right forefoot and rear foot

## 2015-02-24 ENCOUNTER — Ambulatory Visit: Payer: 59 | Admitting: *Deleted

## 2015-02-24 DIAGNOSIS — M722 Plantar fascial fibromatosis: Secondary | ICD-10-CM

## 2015-02-24 NOTE — Progress Notes (Signed)
Patient ID: Meghan Morales, female   DOB: Jan 20, 1961, 54 y.o.   MRN: [id number] Patient presents for orthotic pick up.  Verbal and written break in and wear instructions given.  Patient will follow up in 4 weeks if symptoms worsen or fail to improve.

## 2015-02-24 NOTE — Patient Instructions (Signed)

## 2015-03-05 ENCOUNTER — Inpatient Hospital Stay: Admission: RE | Admit: 2015-03-05 | Payer: Medicare Other | Source: Ambulatory Visit

## 2015-03-13 ENCOUNTER — Other Ambulatory Visit: Payer: Self-pay

## 2015-03-13 DIAGNOSIS — Z1231 Encounter for screening mammogram for malignant neoplasm of breast: Secondary | ICD-10-CM

## 2015-03-16 ENCOUNTER — Ambulatory Visit
Admission: RE | Admit: 2015-03-16 | Discharge: 2015-03-16 | Disposition: A | Discharge status: Home / Self Care | Payer: 59 | Source: Ambulatory Visit

## 2015-03-16 ENCOUNTER — Ambulatory Visit: Payer: Medicare Other

## 2015-03-16 DIAGNOSIS — Z1231 Encounter for screening mammogram for malignant neoplasm of breast: Secondary | ICD-10-CM

## 2015-03-20 ENCOUNTER — Ambulatory Visit
Admission: RE | Admit: 2015-03-20 | Discharge: 2015-03-20 | Disposition: A | Discharge status: Home / Self Care | Payer: 59 | Source: Ambulatory Visit | Attending: Endocrinology | Admitting: Endocrinology

## 2015-03-20 ENCOUNTER — Other Ambulatory Visit: Payer: Self-pay | Admitting: Endocrinology

## 2015-03-20 ENCOUNTER — Encounter (INDEPENDENT_AMBULATORY_CARE_PROVIDER_SITE_OTHER): Payer: Self-pay

## 2015-03-20 DIAGNOSIS — E041 Nontoxic single thyroid nodule: Secondary | ICD-10-CM

## 2015-03-25 ENCOUNTER — Other Ambulatory Visit (HOSPITAL_COMMUNITY): Payer: Self-pay | Admitting: Orthopaedic Surgery

## 2015-03-25 DIAGNOSIS — T84033A Mechanical loosening of internal left knee prosthetic joint, initial encounter: Secondary | ICD-10-CM

## 2015-04-02 ENCOUNTER — Encounter (HOSPITAL_COMMUNITY)
Admission: RE | Admit: 2015-04-02 | Discharge: 2015-04-02 | Disposition: A | Discharge status: Home / Self Care | Payer: 59 | Source: Ambulatory Visit | Attending: Orthopaedic Surgery | Admitting: Orthopaedic Surgery

## 2015-04-02 DIAGNOSIS — X58XXXA Exposure to other specified factors, initial encounter: Secondary | ICD-10-CM | POA: Diagnosis not present

## 2015-04-02 DIAGNOSIS — T84033A Mechanical loosening of internal left knee prosthetic joint, initial encounter: Secondary | ICD-10-CM | POA: Diagnosis present

## 2015-04-02 MED ORDER — TECHNETIUM TC 99M MEDRONATE IV KIT
25.5000 | PACK | Freq: Once | INTRAVENOUS | Status: AC | PRN
  Administered 2015-04-02: 25.5 via INTRAVENOUS

## 2015-07-13 DIAGNOSIS — Z96651 Presence of right artificial knee joint: Secondary | ICD-10-CM | POA: Insufficient documentation

## 2015-07-13 DIAGNOSIS — T84022A Instability of internal right knee prosthesis, initial encounter: Secondary | ICD-10-CM | POA: Insufficient documentation

## 2015-08-19 DIAGNOSIS — E039 Hypothyroidism, unspecified: Secondary | ICD-10-CM | POA: Insufficient documentation

## 2015-08-19 DIAGNOSIS — N951 Menopausal and female climacteric states: Secondary | ICD-10-CM | POA: Insufficient documentation

## 2015-08-19 DIAGNOSIS — I10 Essential (primary) hypertension: Secondary | ICD-10-CM | POA: Insufficient documentation

## 2015-08-19 DIAGNOSIS — E119 Type 2 diabetes mellitus without complications: Secondary | ICD-10-CM | POA: Insufficient documentation

## 2015-08-25 DIAGNOSIS — E559 Vitamin D deficiency, unspecified: Secondary | ICD-10-CM | POA: Insufficient documentation

## 2015-12-02 DIAGNOSIS — N3941 Urge incontinence: Secondary | ICD-10-CM | POA: Insufficient documentation

## 2015-12-02 IMAGING — CR DG CHEST 2V
2 series · 2 of 2 positions shown · non-contrast
Comparison: CT chest and chest radiograph 05/23/2007.

CLINICAL DATA: Preop testing, left total knee arthroplasty.

EXAM:
CHEST  2 VIEW

[w chest pa]
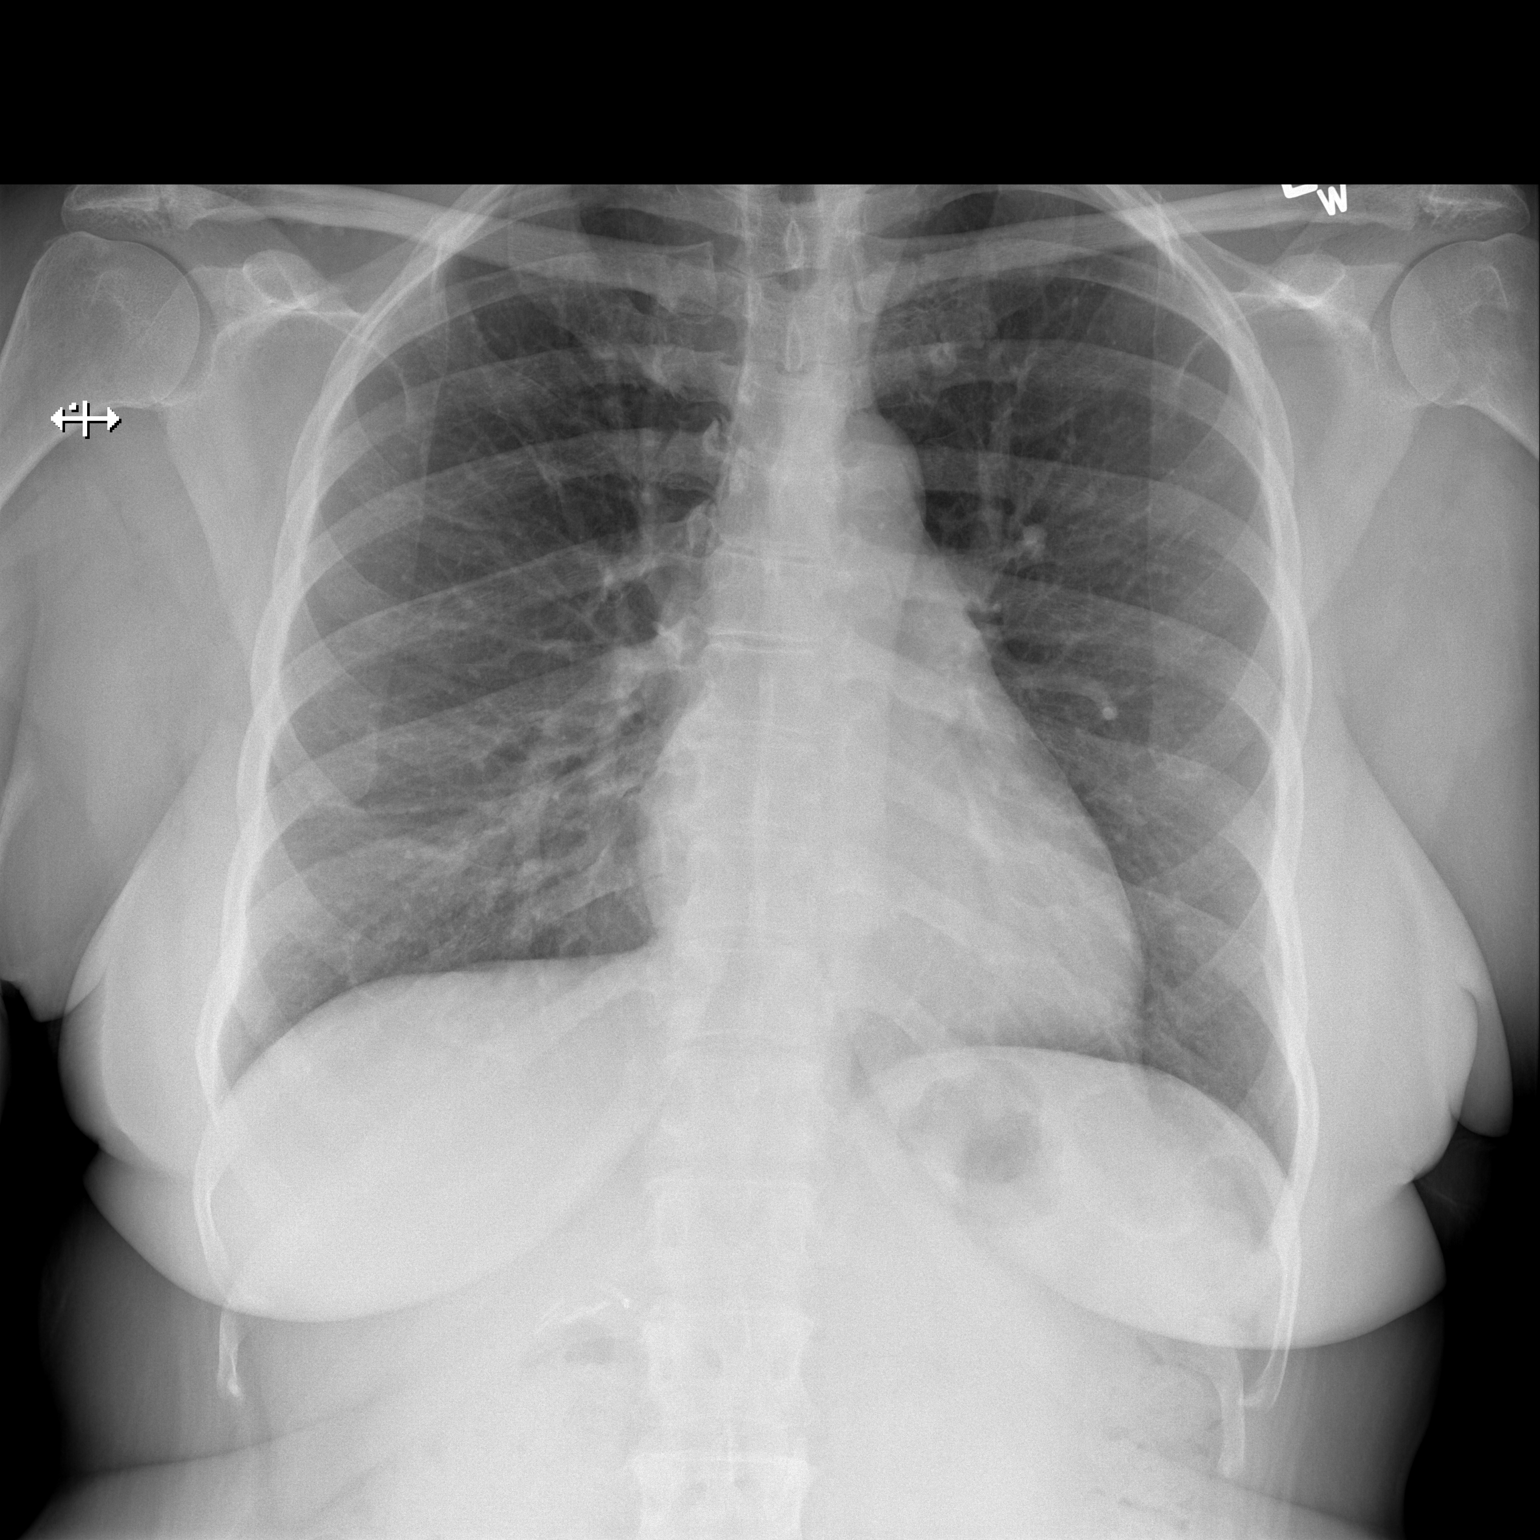

[w chest lat]
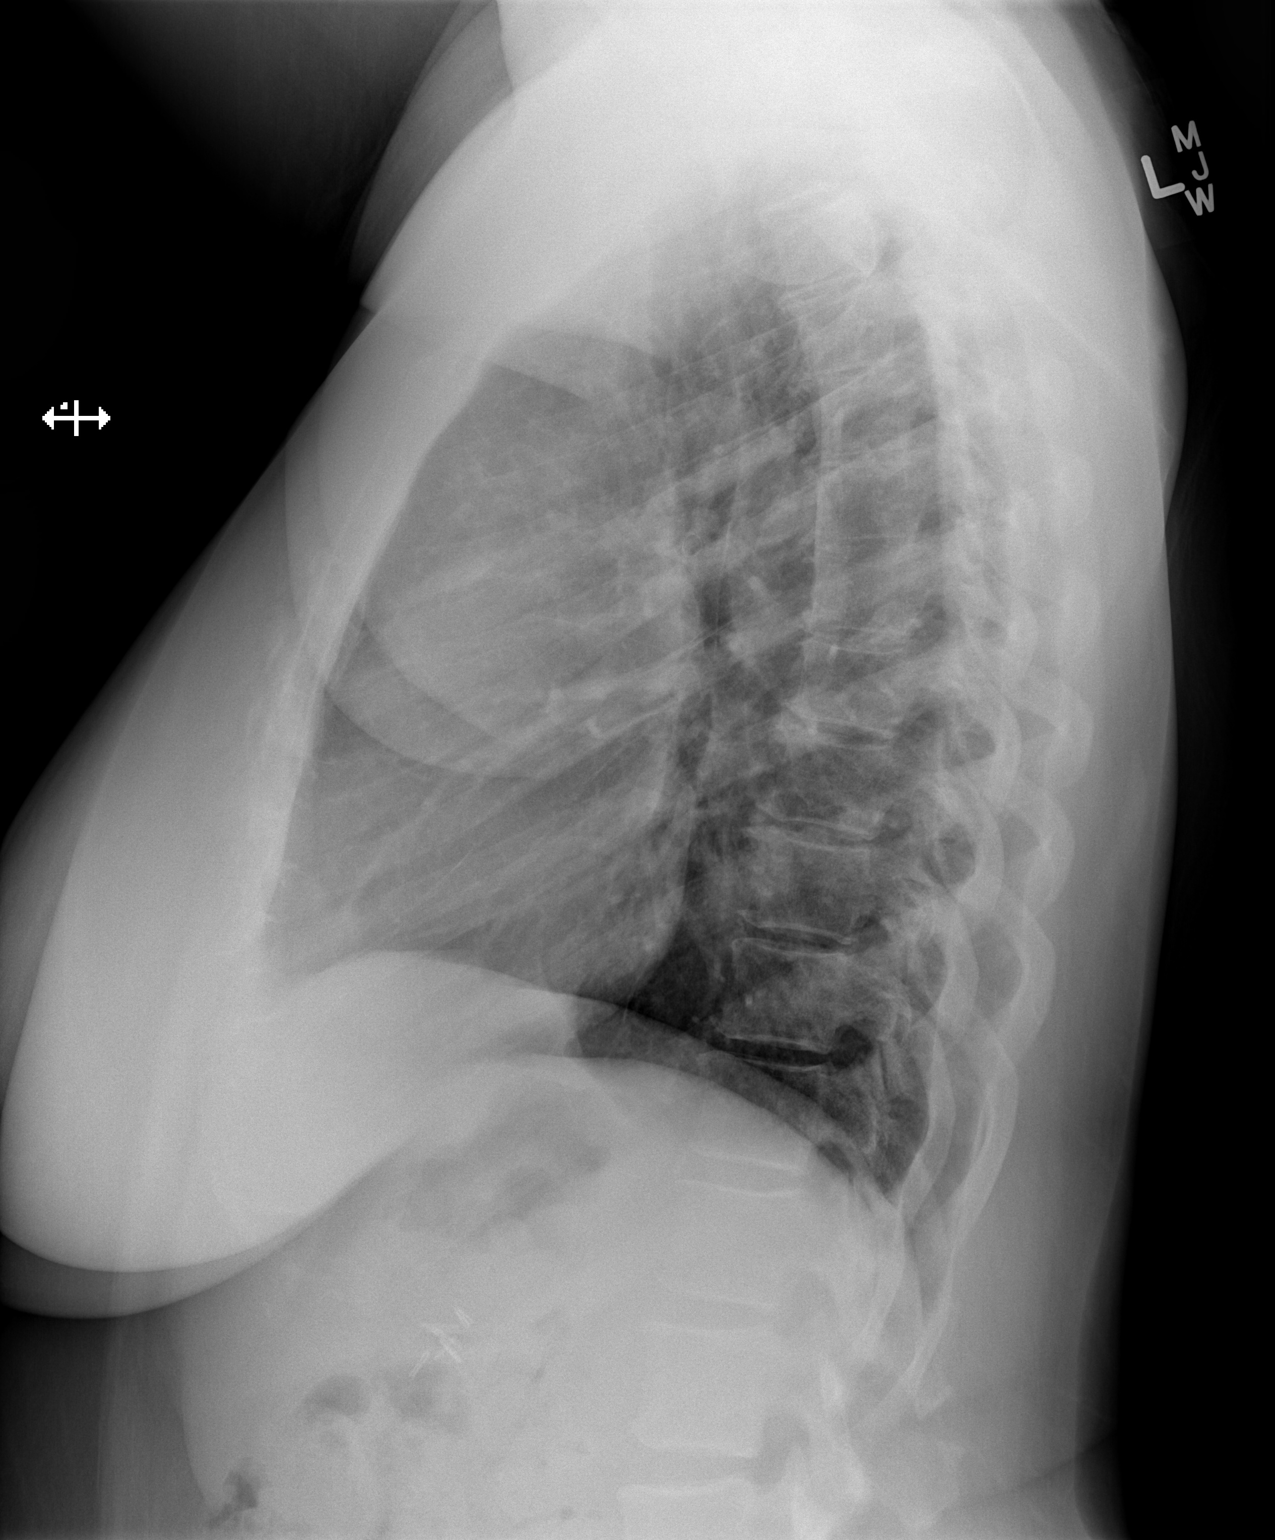

[2 of 2 positions shown; findings below may reference images not displayed]

FINDINGS: Trachea is midline. Heart size normal. Lungs are clear. No pleural
fluid. Degenerative changes are seen in the acromioclavicular
joints.
IMPRESSION: No acute findings.

## 2016-03-17 ENCOUNTER — Other Ambulatory Visit: Payer: Self-pay | Admitting: Internal Medicine

## 2016-03-17 DIAGNOSIS — Z1231 Encounter for screening mammogram for malignant neoplasm of breast: Secondary | ICD-10-CM

## 2016-03-31 ENCOUNTER — Ambulatory Visit
Admission: RE | Admit: 2016-03-31 | Discharge: 2016-03-31 | Disposition: A | Discharge status: Home / Self Care | Payer: 59 | Source: Ambulatory Visit | Attending: Internal Medicine | Admitting: Internal Medicine

## 2016-03-31 DIAGNOSIS — Z1231 Encounter for screening mammogram for malignant neoplasm of breast: Secondary | ICD-10-CM

## 2016-04-04 ENCOUNTER — Other Ambulatory Visit: Payer: Self-pay | Admitting: Internal Medicine

## 2016-04-04 DIAGNOSIS — R928 Other abnormal and inconclusive findings on diagnostic imaging of breast: Secondary | ICD-10-CM

## 2016-04-07 ENCOUNTER — Ambulatory Visit
Admission: RE | Admit: 2016-04-07 | Discharge: 2016-04-07 | Disposition: A | Discharge status: Home / Self Care | Payer: 59 | Source: Ambulatory Visit | Attending: Internal Medicine | Admitting: Internal Medicine

## 2016-04-07 DIAGNOSIS — R928 Other abnormal and inconclusive findings on diagnostic imaging of breast: Secondary | ICD-10-CM

## 2016-04-09 ENCOUNTER — Emergency Department (HOSPITAL_COMMUNITY): Payer: 59

## 2016-04-09 ENCOUNTER — Encounter (HOSPITAL_COMMUNITY): Payer: Self-pay | Admitting: *Deleted

## 2016-04-09 ENCOUNTER — Emergency Department (HOSPITAL_COMMUNITY)
Admission: EM | Admit: 2016-04-09 | Discharge: 2016-04-09 | Disposition: A | Discharge status: Home / Self Care | Payer: 59 | Attending: Emergency Medicine | Admitting: Emergency Medicine

## 2016-04-09 DIAGNOSIS — R079 Chest pain, unspecified: Secondary | ICD-10-CM

## 2016-04-09 DIAGNOSIS — E119 Type 2 diabetes mellitus without complications: Secondary | ICD-10-CM | POA: Diagnosis not present

## 2016-04-09 DIAGNOSIS — Z7982 Long term (current) use of aspirin: Secondary | ICD-10-CM | POA: Insufficient documentation

## 2016-04-09 DIAGNOSIS — E039 Hypothyroidism, unspecified: Secondary | ICD-10-CM | POA: Diagnosis not present

## 2016-04-09 DIAGNOSIS — Z7984 Long term (current) use of oral hypoglycemic drugs: Secondary | ICD-10-CM | POA: Diagnosis not present

## 2016-04-09 DIAGNOSIS — Z96652 Presence of left artificial knee joint: Secondary | ICD-10-CM | POA: Diagnosis not present

## 2016-04-09 DIAGNOSIS — I10 Essential (primary) hypertension: Secondary | ICD-10-CM | POA: Insufficient documentation

## 2016-04-09 DIAGNOSIS — R0789 Other chest pain: Secondary | ICD-10-CM | POA: Insufficient documentation

## 2016-04-09 LAB — CBC
HEMATOCRIT: 36.2 % (ref 36.0–46.0)
Hemoglobin: 11.8 g/dL — ABNORMAL LOW (ref 12.0–15.0)
MCH: 25.3 pg — AB (ref 26.0–34.0)
MCHC: 32.6 g/dL (ref 30.0–36.0)
MCV: 77.5 fL — ABNORMAL LOW (ref 78.0–100.0)
Platelets: 376 10*3/uL (ref 150–400)
RBC: 4.67 MIL/uL (ref 3.87–5.11)
RDW: 14.4 % (ref 11.5–15.5)
WBC: 7.1 10*3/uL (ref 4.0–10.5)

## 2016-04-09 LAB — BASIC METABOLIC PANEL
Anion gap: 10 (ref 5–15)
BUN: 12 mg/dL (ref 6–20)
CALCIUM: 9 mg/dL (ref 8.9–10.3)
CO2: 24 mmol/L (ref 22–32)
Chloride: 102 mmol/L (ref 101–111)
Creatinine, Ser: 0.95 mg/dL (ref 0.44–1.00)
GFR calc Af Amer: >60 mL/min (ref >60)
GLUCOSE: 114 mg/dL — AB (ref 65–99)
Potassium: 3.3 mmol/L — ABNORMAL LOW (ref 3.5–5.1)
Sodium: 136 mmol/L (ref 135–145)

## 2016-04-09 LAB — TROPONIN I

## 2016-04-09 MED ORDER — IBUPROFEN 400 MG PO TABS
600.0000 mg | ORAL_TABLET | Freq: Once | ORAL | Status: AC
  Administered 2016-04-09: 600 mg via ORAL
  Filled 2016-04-09: qty 1

## 2016-04-09 NOTE — ED Provider Notes (Signed)
[facility]-EMERGENCY DEPT Provider Note   CSN: [id number] Arrival date & time: 04/09/16  2050     History   Chief Complaint Chief Complaint  Patient presents with  . Chest Pain    HPI Meghan Morales is a 55 y.o. female.  HPI Patient presents with the 2 week history of chest discomfort which is been fairly constant.  Something similar with this back in 2008 resulted in her being admitted and having a cardiac catheter which wound up being negative.  Patient states it's been present all day long.  But started around 2 weeks ago.  She's also had a dry cough with it.  She states it is worse when she lies down and better when she sits up.  No fever chills. Past Medical History:  Diagnosis Date  . Arthritis    knees & back arithrits   . Diabetes mellitus without complication (HCC)   . Hypertension   . Hypothyroidism   . Urgency of urination     Patient Active Problem List   Diagnosis Date Noted  . Primary osteoarthritis of left knee 06/17/2014  . Obesity 06/17/2014  . Diabetes (HCC) 06/17/2014  . Primary osteoarthritis of knee 06/17/2014    Past Surgical History:  Procedure Laterality Date  . CARDIAC CATHETERIZATION    . CHOLECYSTECTOMY    . KNEE SURGERY     arthroscopy  . TOTAL KNEE ARTHROPLASTY Left 06/17/2014   DR DALLDORF  . TOTAL KNEE ARTHROPLASTY Left 06/17/2014   Procedure: LEFT TOTAL KNEE ARTHROPLASTY;  Surgeon: Velna OchsPeter G Dalldorf, MD;  Location: [facility] OR;  Service: Orthopedics;  Laterality: Left;  . TUBAL LIGATION      OB History    No data available       Home Medications    Prior to Admission medications   Medication Sig Start Date End Date Taking? Authorizing Provider  acetaminophen (TYLENOL) 650 MG CR tablet Take 1,300 mg by mouth every 8 (eight) hours as needed for pain.    Historical Provider, MD  aspirin EC 325 MG EC tablet Take 1 tablet (325 mg total) by mouth 2 (two) times daily after a meal. 06/19/14   Elodia FlorenceAndrew Nida, PA-C  diltiazem (DILACOR XR) 120 MG 24  hr capsule Take 120 mg by mouth daily.    Historical Provider, MD  HYDROcodone-acetaminophen (NORCO/VICODIN) 5-325 MG per tablet Take 1-2 tablets by mouth every 4 (four) hours as needed (breakthrough pain). 06/19/14   Elodia FlorenceAndrew Nida, PA-C  levothyroxine (SYNTHROID, LEVOTHROID) 175 MCG tablet Take 175 mcg by mouth daily before breakfast.    Historical Provider, MD  metFORMIN (GLUCOPHAGE) 500 MG tablet Take 500 mg by mouth daily with breakfast.    Historical Provider, MD  methocarbamol (ROBAXIN) 500 MG tablet Take 1 tablet (500 mg total) by mouth every 6 (six) hours as needed for muscle spasms. 06/19/14   Elodia FlorenceAndrew Nida, PA-C  telmisartan-hydrochlorothiazide (MICARDIS HCT) 80-25 MG per tablet Take 1 tablet by mouth daily.    Historical Provider, MD  tolterodine (DETROL LA) 4 MG 24 hr capsule Take 4 mg by mouth daily.    Historical Provider, MD    Family History No family history on file.  Social History Social History  Substance Use Topics  . Smoking status: Never Smoker  . Smokeless tobacco: Never Used  . Alcohol use No     Allergies   Review of patient's allergies indicates no known allergies.   Review of Systems Review of Systems All other systems reviewed and are negative  Physical Exam Updated Vital Signs BP 176/74 (BP Location: Right Arm)   Pulse 83   Temp 98.3 F (36.8 C)   Resp 16   Ht 5\' 11"  (1.803 m)   Wt 265 lb (120.2 kg)   SpO2 100%   BMI 36.96 kg/m   Physical Exam Physical Exam  Nursing note and vitals reviewed. Constitutional: She is oriented to person, place, and time. She appears well-developed and well-nourished. No distress.  HENT:  Head: Normocephalic and atraumatic.  Eyes: Pupils are equal, round, and reactive to light.  Neck: Normal range of motion.  Cardiovascular: Normal rate and intact distal pulses.   Pulmonary/Chest: No respiratory distress.  Abdominal: Normal appearance. She exhibits no distension.  Musculoskeletal: Normal range of motion.    Neurological: She is alert and oriented to person, place, and time. No cranial nerve deficit.  Skin: Skin is warm and dry. No rash noted.  Psychiatric: She has a normal mood and affect. Her behavior is normal.    ED Treatments / Results  Labs (all labs ordered are listed, but only abnormal results are displayed) Labs Reviewed  BASIC METABOLIC PANEL - Abnormal; Notable for the following:       Result Value   Potassium 3.3 (*)    Glucose, Bld 114 (*)    All other components within normal limits  CBC - Abnormal; Notable for the following:    Hemoglobin 11.8 (*)    MCV 77.5 (*)    MCH 25.3 (*)    All other components within normal limits  TROPONIN I  Heart Score=3  EKG  EKG Interpretation  Date/Time:  Saturday April 09 2016 20:54:32 EDT Ventricular Rate:  77 PR Interval:  164 QRS Duration: 76 QT Interval:  412 QTC Calculation: 466 R Axis:   7 Text Interpretation:  Normal sinus rhythm Cannot rule out Anterior infarct , age undetermined Abnormal ECG No significant change since last tracing Confirmed by Bhumi Godbey  MD, Shanie Mauzy [phone]([id number]) on 04/09/2016 9:18:43 PM       Radiology Dg Chest 2 View  Result Date: 04/09/2016 CLINICAL DATA:  55 year old female with chest pain EXAM: CHEST  2 VIEW COMPARISON:  Chest radiograph dated 06/09/2014 FINDINGS: The heart size and mediastinal contours are within normal limits. Both lungs are clear. The visualized skeletal structures are unremarkable. IMPRESSION: No active cardiopulmonary disease. Electronically Signed   By: Elgie CollardArash  Radparvar M.D.   On: 04/09/2016 22:23    Procedures Procedures (including critical care time)  Medications Ordered in ED Medications  ibuprofen (ADVIL,MOTRIN) tablet 600 mg (not administered)     Initial Impression / Assessment and Plan / ED Course  I have reviewed the triage vital signs and the nursing notes.  Pertinent labs & imaging results that were available during my care of the patient were reviewed by me  and considered in my medical decision making (see chart for details).  Clinical Course      Final Clinical Impressions(s) / ED Diagnoses   Final diagnoses:  Chest pain, unspecified type    New Prescriptions New Prescriptions   No medications on file     Nelva Nayobert Jamir Rone, MD 04/09/16 2240

## 2016-04-09 NOTE — ED Triage Notes (Signed)
THE PT IS C/O LT UPPER CHEST PAIN FOR 2 WEEKS NO SOB DRY COUGH NO PREVIOUS HISTORY

## 2016-04-09 NOTE — Discharge Instructions (Signed)
Take ibuprophen 600mg  three times a day for 2-3 days

## 2016-04-22 DIAGNOSIS — R0789 Other chest pain: Secondary | ICD-10-CM | POA: Insufficient documentation

## 2016-05-12 DIAGNOSIS — Z862 Personal history of diseases of the blood and blood-forming organs and certain disorders involving the immune mechanism: Secondary | ICD-10-CM | POA: Insufficient documentation

## 2016-05-12 DIAGNOSIS — R700 Elevated erythrocyte sedimentation rate: Secondary | ICD-10-CM | POA: Insufficient documentation

## 2016-05-12 DIAGNOSIS — R7982 Elevated C-reactive protein (CRP): Secondary | ICD-10-CM | POA: Insufficient documentation

## 2016-05-16 ENCOUNTER — Other Ambulatory Visit: Payer: Self-pay | Admitting: Endocrinology

## 2016-05-16 DIAGNOSIS — E041 Nontoxic single thyroid nodule: Secondary | ICD-10-CM

## 2016-05-30 ENCOUNTER — Ambulatory Visit
Admission: RE | Admit: 2016-05-30 | Discharge: 2016-05-30 | Disposition: A | Discharge status: Home / Self Care | Payer: 59 | Source: Ambulatory Visit | Attending: Endocrinology | Admitting: Endocrinology

## 2016-05-30 DIAGNOSIS — E041 Nontoxic single thyroid nodule: Secondary | ICD-10-CM

## 2016-06-24 DIAGNOSIS — Z96651 Presence of right artificial knee joint: Secondary | ICD-10-CM | POA: Diagnosis not present

## 2016-06-24 DIAGNOSIS — T84022D Instability of internal right knee prosthesis, subsequent encounter: Secondary | ICD-10-CM | POA: Diagnosis not present

## 2016-08-17 DIAGNOSIS — R079 Chest pain, unspecified: Secondary | ICD-10-CM | POA: Diagnosis not present

## 2016-08-17 DIAGNOSIS — R011 Cardiac murmur, unspecified: Secondary | ICD-10-CM | POA: Diagnosis not present

## 2016-08-17 DIAGNOSIS — I208 Other forms of angina pectoris: Secondary | ICD-10-CM | POA: Diagnosis not present

## 2016-09-01 DIAGNOSIS — R011 Cardiac murmur, unspecified: Secondary | ICD-10-CM | POA: Diagnosis not present

## 2016-09-07 DIAGNOSIS — I208 Other forms of angina pectoris: Secondary | ICD-10-CM | POA: Diagnosis not present

## 2016-09-14 DIAGNOSIS — I208 Other forms of angina pectoris: Secondary | ICD-10-CM | POA: Diagnosis not present

## 2016-09-14 DIAGNOSIS — R079 Chest pain, unspecified: Secondary | ICD-10-CM | POA: Diagnosis not present

## 2016-09-14 DIAGNOSIS — R011 Cardiac murmur, unspecified: Secondary | ICD-10-CM | POA: Diagnosis not present

## 2016-09-21 DIAGNOSIS — R350 Frequency of micturition: Secondary | ICD-10-CM | POA: Diagnosis not present

## 2016-09-21 DIAGNOSIS — R8271 Bacteriuria: Secondary | ICD-10-CM | POA: Diagnosis not present

## 2016-09-21 DIAGNOSIS — R3915 Urgency of urination: Secondary | ICD-10-CM | POA: Diagnosis not present

## 2016-10-10 DIAGNOSIS — R350 Frequency of micturition: Secondary | ICD-10-CM | POA: Diagnosis not present

## 2016-10-12 DIAGNOSIS — E119 Type 2 diabetes mellitus without complications: Secondary | ICD-10-CM | POA: Diagnosis not present

## 2016-10-12 DIAGNOSIS — H2513 Age-related nuclear cataract, bilateral: Secondary | ICD-10-CM | POA: Diagnosis not present

## 2016-11-03 DIAGNOSIS — R350 Frequency of micturition: Secondary | ICD-10-CM | POA: Diagnosis not present

## 2016-12-30 DIAGNOSIS — E119 Type 2 diabetes mellitus without complications: Secondary | ICD-10-CM | POA: Diagnosis not present

## 2017-01-04 DIAGNOSIS — R3129 Other microscopic hematuria: Secondary | ICD-10-CM | POA: Diagnosis not present

## 2017-01-04 DIAGNOSIS — E119 Type 2 diabetes mellitus without complications: Secondary | ICD-10-CM | POA: Diagnosis not present

## 2017-01-04 DIAGNOSIS — E039 Hypothyroidism, unspecified: Secondary | ICD-10-CM | POA: Diagnosis not present

## 2017-01-04 DIAGNOSIS — I10 Essential (primary) hypertension: Secondary | ICD-10-CM | POA: Diagnosis not present

## 2017-01-05 DIAGNOSIS — M79674 Pain in right toe(s): Secondary | ICD-10-CM

## 2017-01-05 DIAGNOSIS — D649 Anemia, unspecified: Secondary | ICD-10-CM | POA: Insufficient documentation

## 2017-01-05 DIAGNOSIS — G8929 Other chronic pain: Secondary | ICD-10-CM | POA: Insufficient documentation

## 2017-03-07 ENCOUNTER — Other Ambulatory Visit: Payer: Self-pay | Admitting: Internal Medicine

## 2017-03-07 DIAGNOSIS — Z1231 Encounter for screening mammogram for malignant neoplasm of breast: Secondary | ICD-10-CM

## 2017-04-03 ENCOUNTER — Ambulatory Visit
Admission: RE | Admit: 2017-04-03 | Discharge: 2017-04-03 | Disposition: A | Discharge status: Home / Self Care | Payer: 59 | Source: Ambulatory Visit | Attending: Internal Medicine | Admitting: Internal Medicine

## 2017-04-03 DIAGNOSIS — Z1231 Encounter for screening mammogram for malignant neoplasm of breast: Secondary | ICD-10-CM

## 2017-04-05 DIAGNOSIS — E119 Type 2 diabetes mellitus without complications: Secondary | ICD-10-CM | POA: Diagnosis not present

## 2017-04-05 DIAGNOSIS — G8929 Other chronic pain: Secondary | ICD-10-CM | POA: Diagnosis not present

## 2017-04-05 DIAGNOSIS — M79674 Pain in right toe(s): Secondary | ICD-10-CM | POA: Diagnosis not present

## 2017-04-05 DIAGNOSIS — D649 Anemia, unspecified: Secondary | ICD-10-CM | POA: Diagnosis not present

## 2017-04-05 DIAGNOSIS — E039 Hypothyroidism, unspecified: Secondary | ICD-10-CM | POA: Diagnosis not present

## 2017-04-18 DIAGNOSIS — E039 Hypothyroidism, unspecified: Secondary | ICD-10-CM | POA: Diagnosis not present

## 2017-04-18 DIAGNOSIS — E119 Type 2 diabetes mellitus without complications: Secondary | ICD-10-CM | POA: Diagnosis not present

## 2017-04-18 DIAGNOSIS — D649 Anemia, unspecified: Secondary | ICD-10-CM | POA: Diagnosis not present

## 2017-05-03 DIAGNOSIS — D649 Anemia, unspecified: Secondary | ICD-10-CM | POA: Diagnosis not present

## 2017-06-07 DIAGNOSIS — N761 Subacute and chronic vaginitis: Secondary | ICD-10-CM | POA: Diagnosis not present

## 2017-06-07 DIAGNOSIS — R202 Paresthesia of skin: Secondary | ICD-10-CM | POA: Diagnosis not present

## 2017-06-07 DIAGNOSIS — R350 Frequency of micturition: Secondary | ICD-10-CM | POA: Diagnosis not present

## 2017-06-30 DIAGNOSIS — Z01419 Encounter for gynecological examination (general) (routine) without abnormal findings: Secondary | ICD-10-CM | POA: Diagnosis not present

## 2017-06-30 DIAGNOSIS — Z124 Encounter for screening for malignant neoplasm of cervix: Secondary | ICD-10-CM | POA: Diagnosis not present

## 2017-07-13 ENCOUNTER — Other Ambulatory Visit: Payer: Self-pay | Admitting: Gastroenterology

## 2017-07-13 DIAGNOSIS — D509 Iron deficiency anemia, unspecified: Secondary | ICD-10-CM | POA: Diagnosis not present

## 2017-07-13 DIAGNOSIS — R1033 Periumbilical pain: Secondary | ICD-10-CM | POA: Diagnosis not present

## 2017-07-20 ENCOUNTER — Ambulatory Visit
Admission: RE | Admit: 2017-07-20 | Discharge: 2017-07-20 | Disposition: A | Discharge status: Home / Self Care | Payer: 59 | Source: Ambulatory Visit | Attending: Gastroenterology | Admitting: Gastroenterology

## 2017-07-20 DIAGNOSIS — R1033 Periumbilical pain: Secondary | ICD-10-CM

## 2017-07-20 MED ORDER — IOPAMIDOL (ISOVUE-300) INJECTION 61%
125.0000 mL | Freq: Once | INTRAVENOUS | Status: AC | PRN
  Administered 2017-07-20: 125 mL via INTRAVENOUS

## 2017-08-04 DIAGNOSIS — N920 Excessive and frequent menstruation with regular cycle: Secondary | ICD-10-CM | POA: Diagnosis not present

## 2017-09-13 DIAGNOSIS — E039 Hypothyroidism, unspecified: Secondary | ICD-10-CM | POA: Diagnosis not present

## 2017-09-13 DIAGNOSIS — E119 Type 2 diabetes mellitus without complications: Secondary | ICD-10-CM | POA: Diagnosis not present

## 2017-09-13 DIAGNOSIS — Z1159 Encounter for screening for other viral diseases: Secondary | ICD-10-CM | POA: Diagnosis not present

## 2017-09-13 DIAGNOSIS — R202 Paresthesia of skin: Secondary | ICD-10-CM | POA: Diagnosis not present

## 2017-09-14 DIAGNOSIS — Z1159 Encounter for screening for other viral diseases: Secondary | ICD-10-CM | POA: Diagnosis not present

## 2017-09-19 DIAGNOSIS — Z23 Encounter for immunization: Secondary | ICD-10-CM | POA: Diagnosis not present

## 2017-09-19 DIAGNOSIS — E049 Nontoxic goiter, unspecified: Secondary | ICD-10-CM | POA: Diagnosis not present

## 2017-09-19 DIAGNOSIS — E039 Hypothyroidism, unspecified: Secondary | ICD-10-CM | POA: Diagnosis not present

## 2017-09-19 DIAGNOSIS — Z0000 Encounter for general adult medical examination without abnormal findings: Secondary | ICD-10-CM | POA: Diagnosis not present

## 2017-10-03 DIAGNOSIS — R079 Chest pain, unspecified: Secondary | ICD-10-CM | POA: Diagnosis not present

## 2017-10-03 DIAGNOSIS — I208 Other forms of angina pectoris: Secondary | ICD-10-CM | POA: Diagnosis not present

## 2017-10-03 DIAGNOSIS — R011 Cardiac murmur, unspecified: Secondary | ICD-10-CM | POA: Diagnosis not present

## 2017-10-06 DIAGNOSIS — Z780 Asymptomatic menopausal state: Secondary | ICD-10-CM | POA: Diagnosis not present

## 2017-10-23 DIAGNOSIS — E119 Type 2 diabetes mellitus without complications: Secondary | ICD-10-CM | POA: Diagnosis not present

## 2017-11-10 DIAGNOSIS — M25561 Pain in right knee: Secondary | ICD-10-CM | POA: Insufficient documentation

## 2017-11-10 DIAGNOSIS — M1711 Unilateral primary osteoarthritis, right knee: Secondary | ICD-10-CM | POA: Diagnosis not present

## 2017-11-27 DIAGNOSIS — R42 Dizziness and giddiness: Secondary | ICD-10-CM | POA: Diagnosis not present

## 2017-12-21 DIAGNOSIS — E039 Hypothyroidism, unspecified: Secondary | ICD-10-CM | POA: Diagnosis not present

## 2017-12-29 DIAGNOSIS — E039 Hypothyroidism, unspecified: Secondary | ICD-10-CM | POA: Diagnosis not present

## 2018-02-08 DIAGNOSIS — M25561 Pain in right knee: Secondary | ICD-10-CM | POA: Diagnosis not present

## 2018-02-08 DIAGNOSIS — M1712 Unilateral primary osteoarthritis, left knee: Secondary | ICD-10-CM | POA: Diagnosis not present

## 2018-02-21 ENCOUNTER — Other Ambulatory Visit: Payer: Self-pay | Admitting: Internal Medicine

## 2018-02-21 DIAGNOSIS — Z1231 Encounter for screening mammogram for malignant neoplasm of breast: Secondary | ICD-10-CM

## 2018-03-27 DIAGNOSIS — E039 Hypothyroidism, unspecified: Secondary | ICD-10-CM | POA: Diagnosis not present

## 2018-03-27 DIAGNOSIS — R7303 Prediabetes: Secondary | ICD-10-CM | POA: Diagnosis not present

## 2018-04-04 ENCOUNTER — Ambulatory Visit
Admission: RE | Admit: 2018-04-04 | Discharge: 2018-04-04 | Disposition: A | Discharge status: Home / Self Care | Payer: 59 | Source: Ambulatory Visit | Attending: Internal Medicine | Admitting: Internal Medicine

## 2018-04-04 DIAGNOSIS — Z1231 Encounter for screening mammogram for malignant neoplasm of breast: Secondary | ICD-10-CM | POA: Diagnosis not present

## 2018-04-06 DIAGNOSIS — N3941 Urge incontinence: Secondary | ICD-10-CM | POA: Diagnosis not present

## 2018-04-06 DIAGNOSIS — I10 Essential (primary) hypertension: Secondary | ICD-10-CM | POA: Diagnosis not present

## 2018-04-06 DIAGNOSIS — E039 Hypothyroidism, unspecified: Secondary | ICD-10-CM | POA: Diagnosis not present

## 2018-06-21 DIAGNOSIS — M1711 Unilateral primary osteoarthritis, right knee: Secondary | ICD-10-CM | POA: Diagnosis not present

## 2018-06-21 DIAGNOSIS — Z96652 Presence of left artificial knee joint: Secondary | ICD-10-CM | POA: Diagnosis not present

## 2018-06-21 DIAGNOSIS — M25561 Pain in right knee: Secondary | ICD-10-CM | POA: Diagnosis not present

## 2018-06-29 DIAGNOSIS — E039 Hypothyroidism, unspecified: Secondary | ICD-10-CM | POA: Diagnosis not present

## 2018-07-06 DIAGNOSIS — E039 Hypothyroidism, unspecified: Secondary | ICD-10-CM | POA: Diagnosis not present

## 2018-07-06 DIAGNOSIS — E119 Type 2 diabetes mellitus without complications: Secondary | ICD-10-CM | POA: Diagnosis not present

## 2018-07-25 ENCOUNTER — Ambulatory Visit: Payer: 59 | Admitting: Podiatry

## 2018-07-25 ENCOUNTER — Ambulatory Visit: Payer: 59

## 2018-07-25 ENCOUNTER — Other Ambulatory Visit: Payer: Self-pay | Admitting: Podiatry

## 2018-07-25 ENCOUNTER — Encounter: Payer: Self-pay | Admitting: Podiatry

## 2018-07-25 ENCOUNTER — Ambulatory Visit (INDEPENDENT_AMBULATORY_CARE_PROVIDER_SITE_OTHER): Payer: 59

## 2018-07-25 DIAGNOSIS — M722 Plantar fascial fibromatosis: Secondary | ICD-10-CM | POA: Diagnosis not present

## 2018-07-25 DIAGNOSIS — M79671 Pain in right foot: Secondary | ICD-10-CM

## 2018-07-25 DIAGNOSIS — M79672 Pain in left foot: Secondary | ICD-10-CM

## 2018-07-25 DIAGNOSIS — N920 Excessive and frequent menstruation with regular cycle: Secondary | ICD-10-CM | POA: Insufficient documentation

## 2018-07-25 DIAGNOSIS — N3281 Overactive bladder: Secondary | ICD-10-CM | POA: Insufficient documentation

## 2018-07-25 DIAGNOSIS — N951 Menopausal and female climacteric states: Secondary | ICD-10-CM | POA: Insufficient documentation

## 2018-07-25 NOTE — Progress Notes (Signed)
Subjective:   Patient ID: Meghan Morales, female   DOB: 58 y.o.   MRN: [id number]   HPI Patient presents concerned about lesion on the bottom of the left and right foot and states there is 2 on the right one on the left and she also has diabetes and wanted to get it checked   Review of Systems  All other systems reviewed and are negative.       Objective:  Physical Exam Vitals signs and nursing note reviewed.  Constitutional:      Appearance: She is well-developed.  Pulmonary:     Effort: Pulmonary effort is normal.  Musculoskeletal: Normal range of motion.  Skin:    General: Skin is warm.  Neurological:     Mental Status: She is alert.     Neurovascular status intact with patient found to have a nodule measuring 2 x 2 centimeter plantar aspect left and 2 nodules that measure 1 x 1 cm on the plantar aspect of the right.  They are moderately enlarged with no current tenderness associated with them and patient had good sharp dull and vibratory     Assessment:  Doing well with diabetes with plantar fibroma bilateral that she thinks is grown slightly in size     Plan:  H&P x-ray reviewed and at this point I recommended watching these and if they should grow in size become painful or change color we will have to consider excision.  She wants to go this route understanding I cannot guarantee what it is but that most likely it is a fibroma formation  X-rays were negative for signs of calcification associated with the lesion formation

## 2018-08-01 DIAGNOSIS — Z01419 Encounter for gynecological examination (general) (routine) without abnormal findings: Secondary | ICD-10-CM | POA: Diagnosis not present

## 2018-10-09 DIAGNOSIS — E119 Type 2 diabetes mellitus without complications: Secondary | ICD-10-CM | POA: Diagnosis not present

## 2018-10-09 DIAGNOSIS — E039 Hypothyroidism, unspecified: Secondary | ICD-10-CM | POA: Diagnosis not present

## 2018-10-18 DIAGNOSIS — Z0000 Encounter for general adult medical examination without abnormal findings: Secondary | ICD-10-CM | POA: Diagnosis not present

## 2018-10-18 DIAGNOSIS — E039 Hypothyroidism, unspecified: Secondary | ICD-10-CM | POA: Diagnosis not present

## 2018-10-18 DIAGNOSIS — Z8639 Personal history of other endocrine, nutritional and metabolic disease: Secondary | ICD-10-CM | POA: Diagnosis not present

## 2018-12-10 ENCOUNTER — Other Ambulatory Visit: Payer: Self-pay

## 2018-12-10 ENCOUNTER — Other Ambulatory Visit: Payer: Self-pay | Admitting: Internal Medicine

## 2018-12-10 DIAGNOSIS — Z20822 Contact with and (suspected) exposure to covid-19: Secondary | ICD-10-CM

## 2018-12-13 LAB — NOVEL CORONAVIRUS, NAA: SARS-CoV-2, NAA: NOT DETECTED

## 2018-12-20 ENCOUNTER — Ambulatory Visit
Admission: RE | Admit: 2018-12-20 | Discharge: 2018-12-20 | Disposition: A | Discharge status: Home / Self Care | Payer: 59 | Source: Ambulatory Visit | Attending: Internal Medicine | Admitting: Internal Medicine

## 2018-12-20 ENCOUNTER — Ambulatory Visit: Payer: 59 | Admitting: Internal Medicine

## 2018-12-20 ENCOUNTER — Other Ambulatory Visit: Payer: Self-pay

## 2018-12-20 ENCOUNTER — Encounter: Payer: Self-pay | Admitting: Internal Medicine

## 2018-12-20 VITALS — BP 160/98 | HR 76 | Temp 97.3°F | Ht 71.0 in | Wt 261.1 lb

## 2018-12-20 DIAGNOSIS — R490 Dysphonia: Secondary | ICD-10-CM

## 2018-12-20 DIAGNOSIS — R0602 Shortness of breath: Secondary | ICD-10-CM

## 2018-12-20 NOTE — Progress Notes (Signed)
Name: Meghan Morales MRN: [id number] DOB: 03/08/1961     CONSULTATION DATE: 12/20/2018  REFERRING MD : Thedore MinsSingh CHIEF COMPLAINT: SOB HISTORY OF PRESENT ILLNESS:  58 year old pleasant African-American female seen today for shortness of breath This has been going on for the past couple of months however most of it is related to hoarseness of voice He does not have any wheezing she does not have any cough No dyspnea on exertion Patient is retired, former Biochemist, clinicaldetention officer  Patient does not have any signs of allergic rhinitis No signs of chest pain or palpitations  Every now and then patient feels like something in the ear is choking her She denies ever having any dysphasia or any types of reflux symptoms  At this time no signs of infection Patient is breathing comfortably at rest    PAST MEDICAL HISTORY :   has a past medical history of Arthritis, Diabetes mellitus without complication (HCC), Hypertension, Hypothyroidism, and Urgency of urination.  has a past surgical history that includes Tubal ligation; Cholecystectomy; Knee surgery; Cardiac catheterization; Total knee arthroplasty (Left, 06/17/2014); and Total knee arthroplasty (Left, 06/17/2014). Prior to Admission medications   Medication Sig Start Date End Date Taking? Authorizing Provider  acetaminophen (TYLENOL) 650 MG CR tablet Take 1,300 mg by mouth every 8 (eight) hours as needed for pain.    [provider]  aspirin 81 MG chewable tablet Chew 81 mg by mouth daily.    [provider]  diltiazem (DILACOR XR) 120 MG 24 hr capsule Take 120 mg by mouth daily.    [provider]  levothyroxine (SYNTHROID, LEVOTHROID) 175 MCG tablet Take 33 mcg by mouth daily before breakfast. Pt stated, "I am taking 33 mcg of this"    [provider]  metFORMIN (GLUCOPHAGE) 500 MG tablet Take 500 mg by mouth 2 (two) times daily with a meal.     [provider]  telmisartan-hydrochlorothiazide  (MICARDIS HCT) 80-25 MG per tablet Take 1 tablet by mouth daily.    [provider]   Allergies  Allergen Reactions  . Oxybutynin Palpitations    FAMILY HISTORY:  HTN SOCIAL HISTORY:  reports that she has never smoked. She has never used smokeless tobacco. She reports that she does not drink alcohol or use drugs.    Review of Systems:  Gen:  Denies  fever, sweats, chills weigh loss  HEENT: Denies blurred vision, double vision, ear pain, eye pain, hearing loss, nose bleeds, sore throat Cardiac:  No dizziness, chest pain or heaviness, chest tightness,edema, No JVD Resp:   No cough, -sputum production, +shortness of breath,-wheezing, -hemoptysis,  Gi: Denies swallowing difficulty, stomach pain, nausea or vomiting, diarrhea, constipation, bowel incontinence Gu:  Denies bladder incontinence, burning urine Ext:   Denies Joint pain, stiffness or swelling Skin: Denies  skin rash, easy bruising or bleeding or hives Endoc:  Denies polyuria, polydipsia , polyphagia or weight change Psych:   Denies depression, insomnia or hallucinations  Other:  All other systems negative    BP (!) 160/98 (BP Location: Left Wrist, Cuff Size: Normal)   Pulse 76   Temp (!) 97.3 F (36.3 C) (Skin)   Ht 5\' 11"  (1.803 m)   Wt 261 lb 1.6 oz (118.4 kg)   SpO2 99%   BMI 36.42 kg/m     Physical Examination:   GENERAL:NAD, no fevers, chills, no weakness no fatigue HEAD: Normocephalic, atraumatic.  EYES: PERLA, EOMI No scleral icterus.  NECK: Supple. No thyromegaly.  No JVD.  PULMONARY: CTA B/L no wheezing, rhonchi, crackles CARDIOVASCULAR: S1 and S2. Regular rate and rhythm. No murmurs GASTROINTESTINAL: Soft, nontender, nondistended. Positive bowel sounds.  MUSCULOSKELETAL: No swelling, clubbing, or edema.  NEUROLOGIC: No gross focal neurological deficits. 5/5 strength all extremities SKIN: No ulceration, lesions, rashes, or cyanosis.  PSYCHIATRIC: Insight, judgment intact. -depression  -anxiety ALL OTHER ROS ARE NEGATIVE   MEDICATIONS: I have reviewed all medications and confirmed regimen as documented    COVID-19 NEGATIVE: Acute COVID-19 infection ruled out by PCR.    CULTURE RESULTS   Recent Results (from the past 240 hour(s))  Novel Coronavirus, NAA ([facility])     Status: None   Collection Time: 12/10/18  4:00 PM   Specimen: Nasal Swab  Result Value Ref Range Status   SARS-CoV-2, NAA Not Detected Not Detected Final    Comment: Testing was performed using the cobas(R) SARS-CoV-2 test. This test was developed and its performance characteristics determined by [employer]. This test has not been FDA cleared or approved. This test has been authorized by FDA under an Emergency Use Authorization (EUA). This test is only authorized for the duration of time the declaration that circumstances exist justifying the authorization of the emergency use of in vitro diagnostic tests for detection of SARS-CoV-2 virus and/or diagnosis of COVID-19 infection under section 564(b)(1) of the Act, 21 U.S.C. 270WCB-7(S)(2), unless the authorization is terminated or revoked sooner. When diagnostic testing is negative, the possibility of a false negative result should be considered in the context of a patient's recent exposures and the presence of clinical signs and symptoms consistent with COVID-19. An individual without symptoms of COVID-19 and who is not shedding SARS-CoV-2 virus would expect to have a negati ve (not detected) result in this assay.            ASSESSMENT AND PLAN SYNOPSIS  58 year old pleasant African-American female with underlying hoarseness of voice with intermittent shortness of breath in the setting of obesity and hypertension  At this time it is a possibility that patient may have silent reflux that is contributing to her symptoms Patient has no previous history of asthma or COPD and is a non-smoker  This time patient will need pulmonary  function testing She will also need chest x-ray PA and lateral 2 view Will also order 6-minute walk test  Patient will need ENT referral and she states she has an ENT appointment in [facility]  We will follow-up after all tests and ENT evaluation completed  COVID-19 EDUCATION: The signs and symptoms of COVID-19 were discussed with the patient and how to seek care for testing.  The importance of social distancing was discussed today. Hand Washing Techniques and avoid touching face was advised.  MEDICATION ADJUSTMENTS/LABS AND TESTS ORDERED: Pulmonary function test 6-minute walk test Chest x-ray PA and lateral   CURRENT MEDICATIONS REVIEWED AT LENGTH WITH PATIENT TODAY   Patient satisfied with Plan of action and management. All questions answered  Follow up in 4 weeks  Dario Yono Patricia Pesa, M.D.  [facility]  Medical Director [place] Director [facility]io-P[facility] Department

## 2018-12-20 NOTE — Patient Instructions (Addendum)
Obtain PFT' s  Obtain 6MWT  ENT CONSULTATION PENDING  CXR 2 VIEW

## 2018-12-25 ENCOUNTER — Telehealth: Payer: Self-pay | Admitting: Internal Medicine

## 2018-12-25 NOTE — Telephone Encounter (Signed)
Hello.  CXR was NORMAL NO ABNORMAL FINDINGS

## 2018-12-25 NOTE — Telephone Encounter (Signed)
Informed pt that Dr.Kasa is not in this week. Advised that I will send message to the doctor and once results have been reviewed and released we would call her back.

## 2018-12-25 NOTE — Telephone Encounter (Signed)
Spoke with Pt notified CXR was normal no further actions necessary at this time.

## 2018-12-25 NOTE — Progress Notes (Signed)
Normal CXR

## 2019-01-01 ENCOUNTER — Other Ambulatory Visit: Payer: Self-pay

## 2019-01-01 ENCOUNTER — Ambulatory Visit (INDEPENDENT_AMBULATORY_CARE_PROVIDER_SITE_OTHER): Payer: 59

## 2019-01-01 DIAGNOSIS — R0609 Other forms of dyspnea: Secondary | ICD-10-CM | POA: Diagnosis not present

## 2019-01-01 NOTE — Progress Notes (Signed)
SIX MIN WALK 01/01/2019  Medications Synthroid 175mg  taken at 6:45a  Supplimental Oxygen during Test? (L/min) No  Laps 14  Partial Lap (in Meters) 0  Baseline BP (sitting) 124/72  Baseline Heartrate 69  Baseline Dyspnea (Borg Scale) 0  Baseline Fatigue (Borg Scale) 0.5  Baseline SPO2 98  BP (sitting) 142/88  Heartrate 90  Dyspnea (Borg Scale) 0.5  Fatigue (Borg Scale) 0.5  SPO2 98  BP (sitting) 136/80  Heartrate 91  SPO2 99  Distance Completed 476  Tech Comments: pt completed test at moderate pace with c/o feeling winded. steady gait and no desat.

## 2019-01-03 ENCOUNTER — Ambulatory Visit (INDEPENDENT_AMBULATORY_CARE_PROVIDER_SITE_OTHER): Payer: 59 | Admitting: Otolaryngology

## 2019-01-04 ENCOUNTER — Other Ambulatory Visit: Payer: Self-pay

## 2019-01-11 ENCOUNTER — Other Ambulatory Visit: Payer: Self-pay

## 2019-01-14 ENCOUNTER — Other Ambulatory Visit: Payer: Self-pay

## 2019-01-14 ENCOUNTER — Telehealth: Payer: Self-pay | Admitting: Internal Medicine

## 2019-01-14 NOTE — Telephone Encounter (Signed)
Pt aware of date/time of covid testing prior to PFT. Pt has no further questions. Order placed.

## 2019-01-18 ENCOUNTER — Other Ambulatory Visit
Admission: RE | Admit: 2019-01-18 | Discharge: 2019-01-18 | Disposition: A | Discharge status: Home / Self Care | Payer: 59 | Source: Ambulatory Visit | Attending: Internal Medicine | Admitting: Internal Medicine

## 2019-01-18 ENCOUNTER — Other Ambulatory Visit: Payer: Self-pay

## 2019-01-18 DIAGNOSIS — Z20828 Contact with and (suspected) exposure to other viral communicable diseases: Secondary | ICD-10-CM | POA: Diagnosis not present

## 2019-01-19 LAB — SARS CORONAVIRUS 2 (TAT 6-24 HRS): SARS Coronavirus 2: NEGATIVE

## 2019-01-23 ENCOUNTER — Ambulatory Visit: Payer: 59 | Attending: Internal Medicine

## 2019-01-23 ENCOUNTER — Other Ambulatory Visit: Payer: Self-pay

## 2019-01-23 DIAGNOSIS — R0602 Shortness of breath: Secondary | ICD-10-CM | POA: Diagnosis present

## 2019-01-28 ENCOUNTER — Encounter: Payer: Self-pay | Admitting: Internal Medicine

## 2019-01-28 ENCOUNTER — Other Ambulatory Visit: Payer: Self-pay

## 2019-01-28 ENCOUNTER — Ambulatory Visit (INDEPENDENT_AMBULATORY_CARE_PROVIDER_SITE_OTHER): Payer: 59 | Admitting: Internal Medicine

## 2019-01-28 DIAGNOSIS — K219 Gastro-esophageal reflux disease without esophagitis: Secondary | ICD-10-CM

## 2019-01-28 DIAGNOSIS — J4520 Mild intermittent asthma, uncomplicated: Secondary | ICD-10-CM | POA: Diagnosis not present

## 2019-01-28 NOTE — Patient Instructions (Signed)
Continue meds for GERD as prescribed  Follow up with ENT May need GI if symptoms persist

## 2019-01-28 NOTE — Progress Notes (Signed)
Name: Meghan Morales MRN: [id number] DOB: 08/16/1960     I connected with the patient by telephone enabled telemedicine visit and verified that I am speaking with the correct person using two identifiers.    I discussed the limitations, risks, security and privacy concerns of performing an evaluation and management service by telemedicine and the availability of in-person appointments. I also discussed with the patient that there may be a patient responsible charge related to this service. The patient expressed understanding and agreed to proceed.  PATIENT AGREES AND CONFIRMS -YES   Other persons participating in the visit and their role in the encounter: Patient, nursing   Patient's location: Home Provider's location: Clinic   I discussed the limitations, risks, security and privacy concerns of performing an evaluation and management service by telephone and the availability of in person appointments. I also discussed with the patient that there may be a patient responsible charge related to this service. The patient expressed understanding and agreed to proceed.  This visit type was conducted due to national recommendations for restrictions regarding the COVID-19 Pandemic (e.g. social distancing).  This format is felt to be most appropriate for this patient at this time.  All issues noted in this document were discussed and addressed.       CONSULTATION DATE: 01/28/2019  REFERRING MD : Thedore MinsSingh   CHIEF COMPLAINT: follow up SOB HISTORY OF PRESENT ILLNESS:  58 year old pleasant African-American female seen today for shortness of breath This has been going on for the past couple of months however most of it is related to hoarseness of voice He does not have any wheezing she does not have any cough No dyspnea on exertion Patient is retired, former Biochemist, clinicaldetention officer  Patient does not have any signs of allergic rhinitis No signs of chest pain or palpitations  Every now and  then patient feels like something in the ear is choking her She denies ever having any dysphasia or any types of reflux symptoms  At this time no signs of infection Patient is breathing comfortably at rest   PFT 12/2018 Spirometry Data Is Acceptable and Reproducible No obvious evidence of Obstructive or restrictive Lung disease There is Hyperinflation which could suggest small obstructive airways disease Recommend weight loss Clinical Correlation Advised   CXR 12/2018- independently reviewedd by me today No acute abnormality of the lungs.  No focal airspace opacity   PAST MEDICAL HISTORY :   has a past medical history of Arthritis, Diabetes mellitus without complication (HCC), Hypertension, Hypothyroidism, and Urgency of urination.  has a past surgical history that includes Tubal ligation; Cholecystectomy; Knee surgery; Cardiac catheterization; Total knee arthroplasty (Left, 06/17/2014); and Total knee arthroplasty (Left, 06/17/2014). Prior to Admission medications   Medication Sig Start Date End Date Taking? Authorizing Provider  acetaminophen (TYLENOL) 650 MG CR tablet Take 1,300 mg by mouth every 8 (eight) hours as needed for pain.    [provider]  aspirin 81 MG chewable tablet Chew 81 mg by mouth daily.    [provider]  diltiazem (DILACOR XR) 120 MG 24 hr capsule Take 120 mg by mouth daily.    [provider]  levothyroxine (SYNTHROID, LEVOTHROID) 175 MCG tablet Take 33 mcg by mouth daily before breakfast. Pt stated, "I am taking 33 mcg of this"    [provider]  metFORMIN (GLUCOPHAGE) 500 MG tablet Take 500 mg by mouth 2 (two) times daily with a meal.     [provider]  telmisartan-hydrochlorothiazide (MICARDIS  HCT) 80-25 MG per tablet Take 1 tablet by mouth daily.    [provider]   Allergies  Allergen Reactions  . Oxybutynin Palpitations     SOCIAL HISTORY:  reports that she has never smoked. She has never  used smokeless tobacco. She reports that she does not drink alcohol or use drugs.    Review of Systems:  Gen:  Denies  fever, sweats, chills weight loss  HEENT: Denies blurred vision, double vision, ear pain, eye pain, hearing loss, nose bleeds, sore throat Cardiac:  No dizziness, chest pain or heaviness, chest tightness,edema, No JVD Resp:   No cough, -sputum production, -shortness of breath,-wheezing, -hemoptysis,  Gi:+GERD, no NVD Gu:  Denies bladder incontinence, burning urine Ext:   Denies Joint pain, stiffness or swelling Skin: Denies  skin rash, easy bruising or bleeding or hives Endoc:  Denies polyuria, polydipsia , polyphagia or weight change Psych:   Denies depression, insomnia or hallucinations  Other:  All other systems negative     MEDICATIONS: I have reviewed all medications and confirmed regimen as documented    COVID-19 NEGATIVE: Acute COVID-19 infection ruled out by PCR.    CULTURE RESULTS   Recent Results (from the past 240 hour(s))  SARS CORONAVIRUS 2 Nasal Swab Aptima Multi Swab     Status: None   Collection Time: 01/18/19 11:07 AM   Specimen: Aptima Multi Swab; Nasal Swab  Result Value Ref Range Status   SARS Coronavirus 2 NEGATIVE NEGATIVE Final    Comment: (NOTE) SARS-CoV-2 target nucleic acids are NOT DETECTED. The SARS-CoV-2 RNA is generally detectable in upper and lower respiratory specimens during the acute phase of infection. Negative results do not preclude SARS-CoV-2 infection, do not rule out co-infections with other pathogens, and should not be used as the sole basis for treatment or other patient management decisions. Negative results must be combined with clinical observations, patient history, and epidemiological information. The expected result is Negative. Fact Sheet for Patients: SugarRoll.be Fact Sheet for Healthcare Providers: https://www.woods-mathews.com/ This test is not yet approved or  cleared by the [place] FDA and  has been authorized for detection and/or diagnosis of SARS-CoV-2 by FDA under an Emergency Use Authorization (EUA). This EUA will remain  in effect (meaning this test can be used) for the duration of the COVID-19 declaration under Section 56 4(b)(1) of the Act, 21 U.S.C. section 360bbb-3(b)(1), unless the authorization is terminated or revoked sooner. Performed at [facility], [place] [address] [place], [place] [address]            ASSESSMENT AND PLAN SYNOPSIS  Hoarse Voice with SOB-resolving Intermittent GERD ENT states she has inflamed and irritated upper airway Started on PPI-feels much better  No wheezing and coughing No signs of infection No need for ABX or steroids   Patient has silent reflux that is contributing to her symptoms of reactive airways disease Patient has no previous history of asthma or COPD and is a non-smoker    COVID-19 EDUCATION: The signs and symptoms of COVID-19 were discussed with the patient and how to seek care for testing.  The importance of social distancing was discussed today. Hand Washing Techniques and avoid touching face was advised.  MEDICATION ADJUSTMENTS/LABS AND TESTS ORDERED: Continue PPI  CURRENT MEDICATIONS REVIEWED AT LENGTH WITH PATIENT TODAY   Patient satisfied with Plan of action and management. All questions answered  Follow up in 1 year  Total time spent 22 mins   Corrin Parker, M.D.  [place]  Medical Director [place] Director [facility] Cardio-Pulmonary Department

## 2019-02-04 NOTE — Telephone Encounter (Signed)
DK please advise?

## 2019-02-28 ENCOUNTER — Other Ambulatory Visit: Payer: Self-pay | Admitting: Family Medicine

## 2019-02-28 DIAGNOSIS — Z1231 Encounter for screening mammogram for malignant neoplasm of breast: Secondary | ICD-10-CM

## 2019-04-08 ENCOUNTER — Other Ambulatory Visit: Payer: Self-pay

## 2019-04-08 DIAGNOSIS — Z20822 Contact with and (suspected) exposure to covid-19: Secondary | ICD-10-CM

## 2019-04-09 LAB — NOVEL CORONAVIRUS, NAA: SARS-CoV-2, NAA: NOT DETECTED

## 2019-04-11 ENCOUNTER — Ambulatory Visit
Admission: RE | Admit: 2019-04-11 | Discharge: 2019-04-11 | Disposition: A | Discharge status: Home / Self Care | Payer: 59 | Source: Ambulatory Visit | Attending: Family Medicine | Admitting: Family Medicine

## 2019-04-11 ENCOUNTER — Other Ambulatory Visit: Payer: Self-pay

## 2019-04-11 DIAGNOSIS — Z1231 Encounter for screening mammogram for malignant neoplasm of breast: Secondary | ICD-10-CM

## 2019-08-02 ENCOUNTER — Ambulatory Visit: Payer: 59 | Attending: Internal Medicine

## 2019-08-02 DIAGNOSIS — Z23 Encounter for immunization: Secondary | ICD-10-CM

## 2019-08-02 NOTE — Progress Notes (Signed)
   Covid-19 Vaccination Clinic  Name:  Meghan Morales    MRN: [id number] DOB: 01/31/1961  08/02/2019  Meghan Morales was observed post Covid-19 immunization for 15 minutes without incidence. She was provided with Vaccine Information Sheet and instruction to access the V-Safe system.   Meghan Morales was instructed to call 911 with any severe reactions post vaccine: Marland Kitchen Difficulty breathing  . Swelling of your face and throat  . A fast heartbeat  . A bad rash all over your body  . Dizziness and weakness    Immunizations Administered    Name Date Dose VIS Date Route   Pfizer COVID-19 Vaccine 08/02/2019 10:48 AM 0.3 mL 05/24/2019 Intramuscular   Manufacturer: [employer], [employer]   Lot: [id number]   NDC: [address]-1

## 2019-08-27 ENCOUNTER — Ambulatory Visit: Payer: 59 | Attending: Internal Medicine

## 2019-08-27 DIAGNOSIS — Z23 Encounter for immunization: Secondary | ICD-10-CM

## 2019-08-27 NOTE — Progress Notes (Signed)
   Covid-19 Vaccination Clinic  Name:  Meghan Morales    MRN: [id number] DOB: 11-15-1960  08/27/2019  Meghan Morales was observed post Covid-19 immunization for 15 minutes without incident. She was provided with Vaccine Information Sheet and instruction to access the V-Safe system.   Meghan Morales was instructed to call 911 with any severe reactions post vaccine: Marland Kitchen Difficulty breathing  . Swelling of face and throat  . A fast heartbeat  . A bad rash all over body  . Dizziness and weakness   Immunizations Administered    Name Date Dose VIS Date Route   Pfizer COVID-19 Vaccine 08/27/2019  8:39 AM 0.3 mL 05/24/2019 Intramuscular   Manufacturer: [employer], [employer]   Lot: [id number]   NDC: [address]-0

## 2019-11-28 ENCOUNTER — Other Ambulatory Visit: Payer: Self-pay | Admitting: Internal Medicine

## 2019-11-28 DIAGNOSIS — M7989 Other specified soft tissue disorders: Secondary | ICD-10-CM

## 2019-11-28 DIAGNOSIS — M79605 Pain in left leg: Secondary | ICD-10-CM

## 2019-12-06 ENCOUNTER — Other Ambulatory Visit: Payer: Self-pay

## 2019-12-06 ENCOUNTER — Ambulatory Visit (HOSPITAL_COMMUNITY)
Admission: RE | Admit: 2019-12-06 | Discharge: 2019-12-06 | Disposition: A | Discharge status: Home / Self Care | Payer: 59 | Source: Ambulatory Visit | Attending: Internal Medicine | Admitting: Internal Medicine

## 2019-12-06 DIAGNOSIS — M7989 Other specified soft tissue disorders: Secondary | ICD-10-CM | POA: Diagnosis not present

## 2019-12-31 ENCOUNTER — Other Ambulatory Visit: Payer: Self-pay

## 2019-12-31 ENCOUNTER — Encounter (HOSPITAL_COMMUNITY): Payer: Self-pay | Admitting: Physical Therapy

## 2019-12-31 ENCOUNTER — Ambulatory Visit (HOSPITAL_COMMUNITY): Payer: 59 | Attending: Sports Medicine | Admitting: Physical Therapy

## 2019-12-31 DIAGNOSIS — R2689 Other abnormalities of gait and mobility: Secondary | ICD-10-CM | POA: Diagnosis present

## 2019-12-31 DIAGNOSIS — M25551 Pain in right hip: Secondary | ICD-10-CM | POA: Diagnosis not present

## 2019-12-31 DIAGNOSIS — M25552 Pain in left hip: Secondary | ICD-10-CM | POA: Insufficient documentation

## 2019-12-31 DIAGNOSIS — M6281 Muscle weakness (generalized): Secondary | ICD-10-CM | POA: Diagnosis present

## 2019-12-31 DIAGNOSIS — M25662 Stiffness of left knee, not elsewhere classified: Secondary | ICD-10-CM | POA: Diagnosis present

## 2019-12-31 NOTE — Therapy (Signed)
[facility] [address] [place], [place], [address] Phone: [phone]   Fax:  [phone]  Physical Therapy Evaluation  Patient Details  Name: Meghan Morales MRN: [id number] Date of Birth: 10/08/1960 Referring Provider (PT): Dorthula NettlesAndrew Kubinski, DO   Encounter Date: 12/31/2019   PT End of Session - 12/31/19 1654    Visit Number 1    Number of Visits 12    Date for PT Re-Evaluation 02/11/20    Authorization Type [employer]    Authorization - Visit Number 1    Authorization - Number of Visits 60    Progress Note Due on Visit 10    PT Start Time 1600    PT Stop Time 1645    PT Time Calculation (min) 45 min    Activity Tolerance Patient tolerated treatment well    Behavior [facility] for tasks assessed/performed           Past Medical History:  Diagnosis Date  . Arthritis    knees & back arithrits   . Diabetes mellitus without complication (HCC)   . Hypertension   . Hypothyroidism   . Urgency of urination     Past Surgical History:  Procedure Laterality Date  . CARDIAC CATHETERIZATION    . CHOLECYSTECTOMY    . KNEE SURGERY     arthroscopy  . TOTAL KNEE ARTHROPLASTY Left 06/17/2014   DR DALLDORF  . TOTAL KNEE ARTHROPLASTY Left 06/17/2014   Procedure: LEFT TOTAL KNEE ARTHROPLASTY;  Surgeon: Velna OchsPeter G Dalldorf, MD;  Location: [facility] OR;  Service: Orthopedics;  Laterality: Left;  . TUBAL LIGATION      There were no vitals filed for this visit.    Subjective Assessment - 12/31/19 1606    Subjective Patient reported that her pain is mostly when she lays on her sides in her hips. She stated that it is more of a burning sensation down her legs on the lateral side which stops at her ankles. She reported that whichever side she lays on is the side that's the side that bothers her. She stated that when she lays on the right side it hurts just in her hip region, but on the left side it goes all the way down to her  ankle. The patient denied any back pain, but that she does have some stiffness especially in the morning. She stated that she had a LT TKA in 2016 and that since then she feels like it never really felt completely normal again.    Pertinent History History of LT TKA 2016    Diagnostic tests US: negative for DVT. X-rays: Negative per patient    Patient Stated Goals To resolve hip pain    Currently in Pain? No/denies              [facility] - 12/31[facility]19 0001      Assessment   Medical Diagnosis Bilateral hip pain; Trochanteric bursitis    Referring Provider (PT) Dorthula NettlesAndrew Kubinski, DO    Onset Date/Surgical Date --   6 months to a year ago   Next MD Visit --   None aware of    Prior Therapy Yes following TKA      Precautions   Precautions None      Restrictions   Weight Bearing Restrictions No      Balance Screen   Has the patient fallen in the past 6 months No      Home Environment   Living  Environment Private residence    Type of Home House    Home Layout Two level    Alternate Level Stairs-Number of Steps 5    Alternate Level Stairs-Rails --      Prior Function   Level of Independence Independent    Vocation Part time employment    Vocation Requirements Sitting    Leisure Reading      Cognition   Overall Cognitive Status Within Functional Limits for tasks assessed      Observation/Other Assessments   Focus on Therapeutic Outcomes (FOTO)  69%      Sensation   Light Touch Appears Intact      ROM / Strength   AROM / PROM / Strength AROM;Strength;PROM      AROM   AROM Assessment Site Lumbar;Knee    Right/Left Knee Left    Left Knee Extension 0    Left Knee Flexion 112    Lumbar Flexion WFL     Lumbar Extension WFL    Lumbar - Right Side Bend [facility]    Lumbar - Left Side Bend WFL    Lumbar - Right Rotation WFL    Lumbar - Left Rotation WFL      PROM   Overall PROM Comments Patient's LT and RT hip IR/ER PROM WFL. Scour negative      Strength   Strength  Assessment Site Hip;Knee;Ankle    Right/Left Hip Right;Left    Right Hip Flexion 4+/5    Right Hip Extension 3/5    Right Hip ABduction 3-/5    Left Hip Flexion 4+/5    Left Hip Extension 3/5    Left Hip ABduction 3-/5    Right/Left Knee Right;Left    Right Knee Flexion 5/5    Right Knee Extension 5/5    Left Knee Flexion 5/5    Left Knee Extension 5/5    Right/Left Ankle Right;Left    Right Ankle Dorsiflexion 5/5    Left Ankle Dorsiflexion 5/5      Palpation   Palpation comment Patient reported significant tenderness to palpation of left greater trochanter and anterior to trochanter      Ambulation/Gait   Ambulation/Gait Yes    Ambulation Distance (Feet) 470 Feet     Assistive device None    Gait Pattern Decreased trunk rotation    Ambulation Surface Level;Indoor    Gait Comments Increased anterior and posterior rotation of hips with ambulation      Balance   Balance Assessed Yes      Static Standing Balance   Static Standing - Balance Support No upper extremity supported    Static Standing Balance -  Activities  Single Leg Stance - Right Leg;Single Leg Stance - Left Leg    Static Standing - Comment/# of Minutes 4'' RT; 6'' LT                      Objective measurements completed on examination: See above findings.               PT Education - 12/31/19 1654    Education Details Discussed examination findings and POC.    Person(s) Educated Patient    Methods Explanation    Comprehension Verbalized understanding            PT Short Term Goals - 12/31/19 1655      PT SHORT TERM GOAL #1   Title Patient will report understanding and regular compliance with HEP to improve strength,  functional mobility, and decrease pain.    Time 3    Period Weeks    Status New    Target Date 01/21/20      PT SHORT TERM GOAL #2   Title Patient will report an improvement of at least 25% in overall subjective complaint for improved QOL.    Time 3     Period Weeks    Status New    Target Date 01/21/20             PT Long Term Goals - 12/31/19 1656      PT LONG TERM GOAL #1   Title Patient will report an improvement of at least 50% in overall subjective complaint for improved QOL.    Time 6    Period Weeks    Status New    Target Date 02/11/20      PT LONG TERM GOAL #2   Title Patient will demonstrate LT knee AROM flexion of at least 120 degrees to improve mechanics with functional mobility and decreased stress on her hips.    Time 6    Period Weeks    Status New    Target Date 02/11/20      PT LONG TERM GOAL #3   Title Patient will demonstrate ability to perform SLS for at least 10 seconds indicating improved stability and ease of naviagating stairs.    Time 6    Period Weeks    Status New    Target Date 02/11/20                  Plan - 12/31/19 1702    Clinical Impression Statement Patient is a 59 year old female who presents to outpatient physical therapy with primary complaints of left and right hip pain. Patient demonstrates decreased hip strength particularly in left hip abduction. Patient also demonstrated decreased left knee flexion AROM which may be contributing to overburdening patient's hips. Patient demonstrated decreased balance on bilateral LEs as well. Patient would benefit from continued skilled physical therapy in order to address the abovementioned deficits and help patient return to her PLOF.    Personal Factors and Comorbidities Age;Comorbidity 3+    Comorbidities HTN, hypothyroidism, LT TKA    Examination-Activity Limitations Bed Mobility    Examination-Participation Restrictions Other   Sleep   Stability/Clinical Decision Making Stable/Uncomplicated    Clinical Decision Making Low    Rehab Potential Good    PT Frequency 2x / week    PT Duration 6 weeks    PT Treatment/Interventions ADLs/Self Care Home Management;Aquatic Therapy;Cryotherapy;Electrical Stimulation;Moist Heat;Traction;DME  Instruction;Gait training;Stair training;Functional mobility training;Therapeutic activities;Therapeutic exercise;Balance training;Neuromuscular re-education;Patient/family education;Orthotic Fit/Training;Manual techniques;Scar mobilization;Passive range of motion;Dry needling;Energy conservation;Taping    PT Next Visit Plan Perform manual to decrease tightness of gluteals and IT band. Education on HEP: hip isometrics and gluteal stretching. Trial LTR    PT Home Exercise Plan Initiate at next treatment    Consulted and Agree with Plan of Care Patient           Patient will benefit from skilled therapeutic intervention in order to improve the following deficits and impairments:  Abnormal gait, Pain, Improper body mechanics, Decreased mobility, Decreased activity tolerance, Decreased strength, Decreased balance, Impaired flexibility  Visit Diagnosis: Pain in right hip  Pain in left hip  Muscle weakness (generalized)  Stiffness of left knee, not elsewhere classified  Other abnormalities of gait and mobility     Problem List Patient Active Problem List   Diagnosis Date  Noted  . Overactive bladder 07/25/2018  . Menopausal syndrome 07/25/2018  . Menorrhagia 07/25/2018  . Pain in right knee 11/10/2017  . Chronic anemia 01/05/2017  . Chronic toe pain, right foot 01/05/2017  . CRP elevated 05/12/2016  . Elevated erythrocyte sedimentation rate 05/12/2016  . History of normocytic normochromic anemia 05/12/2016  . Atypical chest pain 04/22/2016  . Urge incontinence 12/02/2015  . Vitamin D deficiency 08/25/2015  . Acquired hypothyroidism 08/19/2015  . Essential hypertension 08/19/2015  . Hot flushes, perimenopausal 08/19/2015  . Type 2 diabetes mellitus without complication, without long-term current use of insulin (HCC) 08/19/2015  . 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 Presence of right artificial knee joint 07/13/2015  . Primary osteoarthritis of left  knee 06/17/2014  . Obesity 06/17/2014  . Diabetes (HCC) 06/17/2014  . Primary osteoarthritis of knee 06/17/2014   Verne Carrow PT, DPT 5:06 PM, 12/31/19 [phone]  [facility] [address] [place], [place], [address] Phone: [phone]   Fax:  [phone]  Name: Meghan Morales MRN: [id number] Date of Birth: 01/08/1961

## 2020-01-02 ENCOUNTER — Other Ambulatory Visit: Payer: Self-pay

## 2020-01-02 ENCOUNTER — Encounter (HOSPITAL_COMMUNITY): Payer: Self-pay | Admitting: Physical Therapy

## 2020-01-02 ENCOUNTER — Ambulatory Visit (HOSPITAL_COMMUNITY): Payer: 59 | Admitting: Physical Therapy

## 2020-01-02 DIAGNOSIS — M25551 Pain in right hip: Secondary | ICD-10-CM | POA: Diagnosis not present

## 2020-01-02 DIAGNOSIS — M6281 Muscle weakness (generalized): Secondary | ICD-10-CM

## 2020-01-02 DIAGNOSIS — M25662 Stiffness of left knee, not elsewhere classified: Secondary | ICD-10-CM

## 2020-01-02 DIAGNOSIS — R2689 Other abnormalities of gait and mobility: Secondary | ICD-10-CM

## 2020-01-02 DIAGNOSIS — M25552 Pain in left hip: Secondary | ICD-10-CM

## 2020-01-02 NOTE — Therapy (Signed)
[facility] [address] [place], [place], [address] Phone: [phone]   Fax:  [phone]  Physical Therapy Treatment  Patient Details  Name: Meghan Morales MRN: [id number] Date of Birth: 02-21-1961 Referring Provider (PT): Dorthula Nettles, DO   Encounter Date: 01/02/2020   PT End of Session - 01/02/20 1456    Visit Number 2    Number of Visits 12    Date for PT Re-Evaluation 02/11/20    Authorization Type [employer]    Authorization - Visit Number 2    Authorization - Number of Visits 60    Progress Note Due on Visit 10    PT Start Time 1445    PT Stop Time 1515    PT Time Calculation (min) 30 min    Activity Tolerance Patient tolerated treatment well    Behavior During Therapy [facility] for tasks assessed/performed           Past Medical History:  Diagnosis Date  . Arthritis    knees & back arithrits   . Diabetes mellitus without complication (HCC)   . Hypertension   . Hypothyroidism   . Urgency of urination     Past Surgical History:  Procedure Laterality Date  . CARDIAC CATHETERIZATION    . CHOLECYSTECTOMY    . KNEE SURGERY     arthroscopy  . TOTAL KNEE ARTHROPLASTY Left 06/17/2014   DR DALLDORF  . TOTAL KNEE ARTHROPLASTY Left 06/17/2014   Procedure: LEFT TOTAL KNEE ARTHROPLASTY;  Surgeon: Velna Ochs, MD;  Location: [facility] OR;  Service: Orthopedics;  Laterality: Left;  . TUBAL LIGATION      There were no vitals filed for this visit.   Subjective Assessment - 01/02/20 1449    Subjective Patient reported that she is not having any pain currently.    Pertinent History History of LT TKA 2016    Diagnostic tests [place]: negative for DVT. X-rays: Negative per patient    Patient Stated Goals To resolve hip pain    Currently in Pain? No/denies                             [facility] Adult PT Treatment/Exercise - 01/02/20 0001      Exercises   Exercises Knee/Hip      Knee/Hip Exercises:  Stretches   Other Knee/Hip Stretches Gluteal stretch 3x30'' each LE      Knee/Hip Exercises: Supine   Other Supine Knee/Hip Exercises Isometric hip adduction and abduction 10x10'' with ball and strap      Manual Therapy   Manual Therapy Soft tissue mobilization    Manual therapy comments All manual completed separately from all other skilled interventions    Soft tissue mobilization STM to right gluteus medius and IT band IASTM with green weighted ball                  PT Education - 01/02/20 1451    Education Details Educated on HEP.    Person(s) Educated Patient    Methods Explanation;Handout    Comprehension Verbalized understanding            PT Short Term Goals - 01/02/20 1618      PT SHORT TERM GOAL #1   Title Patient will report understanding and regular compliance with HEP to improve strength, functional mobility, and decrease pain.    Time 3    Period Weeks    Status On-going  Target Date 01/21/20      PT SHORT TERM GOAL #2   Title Patient will report an improvement of at least 25% in overall subjective complaint for improved QOL.    Time 3    Period Weeks    Status On-going    Target Date 01/21/20             PT Long Term Goals - 01/02/20 1618      PT LONG TERM GOAL #1   Title Patient will report an improvement of at least 50% in overall subjective complaint for improved QOL.    Time 6    Period Weeks    Status On-going      PT LONG TERM GOAL #2   Title Patient will demonstrate LT knee AROM flexion of at least 120 degrees to improve mechanics with functional mobility and decreased stress on her hips.    Time 6    Period Weeks    Status On-going      PT LONG TERM GOAL #3   Title Patient will demonstrate ability to perform SLS for at least 10 seconds indicating improved stability and ease of naviagating stairs.    Time 6    Period Weeks    Status On-going                 Plan - 01/02/20 1616    Clinical Impression Statement  Began session by discussing exercises for HEP including hip isometrics as well as gluteal stretching. Patient performed with moderate cueing for form and helped modify the hip stretch on the left lower extremity to reduce pressure on left knee and target left hip. This session also initiated manual therapy to improve muscle extensibility and decrease pain. Patient reported feeling like the massage helped decrease tightness in her hip.    Personal Factors and Comorbidities Age;Comorbidity 3+    Comorbidities HTN, hypothyroidism, LT TKA    Examination-Activity Limitations Bed Mobility    Examination-Participation Restrictions Other   Sleep   Stability/Clinical Decision Making Stable/Uncomplicated    Rehab Potential Good    PT Frequency 2x / week    PT Duration 6 weeks    PT Treatment/Interventions ADLs/Self Care Home Management;Aquatic Therapy;Cryotherapy;Electrical Stimulation;Moist Heat;Traction;DME Instruction;Gait training;Stair training;Functional mobility training;Therapeutic activities;Therapeutic exercise;Balance training;Neuromuscular re-education;Patient/family education;Orthotic Fit/Training;Manual techniques;Scar mobilization;Passive range of motion;Dry needling;Energy conservation;Taping    PT Next Visit Plan Perform manual to decrease tightness of gluteals and IT band. Trial LTR    PT Home Exercise Plan 01/02/20: Gluteal stretch, hip adduction isometric and abduction    Consulted and Agree with Plan of Care Patient           Patient will benefit from skilled therapeutic intervention in order to improve the following deficits and impairments:  Abnormal gait, Pain, Improper body mechanics, Decreased mobility, Decreased activity tolerance, Decreased strength, Decreased balance, Impaired flexibility  Visit Diagnosis: Pain in right hip  Pain in left hip  Muscle weakness (generalized)  Stiffness of left knee, not elsewhere classified  Other abnormalities of gait and  mobility     Problem List Patient Active Problem List   Diagnosis Date Noted  . Overactive bladder 07/25/2018  . Menopausal syndrome 07/25/2018  . Menorrhagia 07/25/2018  . Pain in right knee 11/10/2017  . Chronic anemia 01/05/2017  . Chronic toe pain, right foot 01/05/2017  . CRP elevated 05/12/2016  . Elevated erythrocyte sedimentation rate 05/12/2016  . History of normocytic normochromic anemia 05/12/2016  . Atypical chest pain 04/22/2016  . Urge  incontinence 12/02/2015  . Vitamin D deficiency 08/25/2015  . Acquired hypothyroidism 08/19/2015  . Essential hypertension 08/19/2015  . Hot flushes, perimenopausal 08/19/2015  . Type 2 diabetes mellitus without complication, without long-term current use of insulin (HCC) 08/19/2015  . 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 Presence of right artificial knee joint 07/13/2015  . Primary osteoarthritis of left knee 06/17/2014  . Obesity 06/17/2014  . Diabetes (HCC) 06/17/2014  . Primary osteoarthritis of knee 06/17/2014   Verne Carrow PT, DPT 4:20 PM, 01/02/20 [phone]  [facility] [address] [place], [place], [address] Phone: [phone]   Fax:  [phone]  Name: Meghan Morales MRN: [id number] Date of Birth: 02/19/1961

## 2020-01-08 ENCOUNTER — Ambulatory Visit (INDEPENDENT_AMBULATORY_CARE_PROVIDER_SITE_OTHER): Payer: 59

## 2020-01-08 ENCOUNTER — Encounter: Payer: Self-pay | Admitting: Podiatry

## 2020-01-08 ENCOUNTER — Ambulatory Visit: Payer: 59 | Admitting: Podiatry

## 2020-01-08 ENCOUNTER — Other Ambulatory Visit: Payer: Self-pay

## 2020-01-08 DIAGNOSIS — M21619 Bunion of unspecified foot: Secondary | ICD-10-CM | POA: Diagnosis not present

## 2020-01-08 DIAGNOSIS — M79672 Pain in left foot: Secondary | ICD-10-CM | POA: Diagnosis not present

## 2020-01-08 DIAGNOSIS — M79671 Pain in right foot: Secondary | ICD-10-CM | POA: Diagnosis not present

## 2020-01-08 DIAGNOSIS — M2061 Acquired deformities of toe(s), unspecified, right foot: Secondary | ICD-10-CM

## 2020-01-08 NOTE — Progress Notes (Signed)
Subjective:   Patient ID: Meghan Morales, female   DOB: 59 y.o.   MRN: [id number]   HPI Patient presents stating that she has developed a bunion right and she needs her feet checked in general.  States that it occasionally getting and it seems like it worsened over the last few months   ROS      Objective:  Physical Exam  Neurovascular status was found to be intact bilateral with normal range of motion normal muscle strength.  There is some prominence around the first metatarsal head right with localized redness of the area with good range of motion no crepitus      Assessment:  Appears to be bunion deformity right which may have been present for a while or gradually getting worse with overall generalized foot pain, mild nature and long-term diabetes under good control     Plan:  H&P x-rays reviewed conditions discussed at great length.  At this point I have recommended wider shoes and softer materials and I gave her strict instructions on diabetic foot examinations on a daily basis.  Patient will be seen back for [place] to reevaluate  X-rays indicate significant structural bunion deformity right with elevation of the intermetatarsal angle and spurring around the first metatarsal head and I did explain at 1 point in future this may require surgical intervention

## 2020-01-08 NOTE — Patient Instructions (Signed)
Diabetes Mellitus and Foot Care Foot care is an important part of your health, especially when you have diabetes. Diabetes may cause you to have problems because of poor blood flow (circulation) to your feet and legs, which can cause your skin to:  Become thinner and drier.  Break more easily.  Heal more slowly.  Peel and crack. You may also have nerve damage (neuropathy) in your legs and feet, causing decreased feeling in them. This means that you may not notice minor injuries to your feet that could lead to more serious problems. Noticing and addressing any potential problems early is the best way to prevent future foot problems. How to care for your feet Foot hygiene  Wash your feet daily with warm water and mild soap. Do not use hot water. Then, pat your feet and the areas between your toes until they are completely dry. Do not soak your feet as this can dry your skin.  Trim your toenails straight across. Do not dig under them or around the cuticle. File the edges of your nails with an emery board or nail file.  Apply a moisturizing lotion or petroleum jelly to the skin on your feet and to dry, brittle toenails. Use lotion that does not contain alcohol and is unscented. Do not apply lotion between your toes. Shoes and socks  Wear clean socks or stockings every day. Make sure they are not too tight. Do not wear knee-high stockings since they may decrease blood flow to your legs.  Wear shoes that fit properly and have enough cushioning. Always look in your shoes before you put them on to be sure there are no objects inside.  To break in new shoes, wear them for just a few hours a day. This prevents injuries on your feet. Wounds, scrapes, corns, and calluses  Check your feet daily for blisters, cuts, bruises, sores, and redness. If you cannot see the bottom of your feet, use a mirror or ask someone for help.  Do not cut corns or calluses or try to remove them with medicine.  If you  find a minor scrape, cut, or break in the skin on your feet, keep it and the skin around it clean and dry. You may clean these areas with mild soap and water. Do not clean the area with peroxide, alcohol, or iodine.  If you have a wound, scrape, corn, or callus on your foot, look at it several times a day to make sure it is healing and not infected. Check for: ? Redness, swelling, or pain. ? Fluid or blood. ? Warmth. ? Pus or a bad smell. General instructions  Do not cross your legs. This may decrease blood flow to your feet.  Do not use heating pads or hot water bottles on your feet. They may burn your skin. If you have lost feeling in your feet or legs, you may not know this is happening until it is too late.  Protect your feet from hot and cold by wearing shoes, such as at the beach or on hot pavement.  Schedule a complete foot exam at least once a year (annually) or more often if you have foot problems. If you have foot problems, report any cuts, sores, or bruises to your health care provider immediately. Contact a health care provider if:  You have a medical condition that increases your risk of infection and you have any cuts, sores, or bruises on your feet.  You have an injury that is not   healing.  You have redness on your legs or feet.  You feel burning or tingling in your legs or feet.  You have pain or cramps in your legs and feet.  Your legs or feet are numb.  Your feet always feel cold.  You have pain around a toenail. Get help right away if:  You have a wound, scrape, corn, or callus on your foot and: ? You have pain, swelling, or redness that gets worse. ? You have fluid or blood coming from the wound, scrape, corn, or callus. ? Your wound, scrape, corn, or callus feels warm to the touch. ? You have pus or a bad smell coming from the wound, scrape, corn, or callus. ? You have a fever. ? You have a red line going up your leg. Summary  Check your feet every day  for cuts, sores, red spots, swelling, and blisters.  Moisturize feet and legs daily.  Wear shoes that fit properly and have enough cushioning.  If you have foot problems, report any cuts, sores, or bruises to your health care provider immediately.  Schedule a complete foot exam at least once a year (annually) or more often if you have foot problems. This information is not intended to replace advice given to you by your health care provider. Make sure you discuss any questions you have with your health care provider. Document Revised: 02/20/2019 Document Reviewed: 07/01/2016 Elsevier Patient Education  2020 [employer]  

## 2020-01-09 ENCOUNTER — Encounter (HOSPITAL_COMMUNITY): Payer: Self-pay | Admitting: Physical Therapy

## 2020-01-09 ENCOUNTER — Ambulatory Visit (HOSPITAL_COMMUNITY): Payer: 59 | Admitting: Physical Therapy

## 2020-01-09 DIAGNOSIS — M25662 Stiffness of left knee, not elsewhere classified: Secondary | ICD-10-CM

## 2020-01-09 DIAGNOSIS — M25551 Pain in right hip: Secondary | ICD-10-CM | POA: Diagnosis not present

## 2020-01-09 DIAGNOSIS — R2689 Other abnormalities of gait and mobility: Secondary | ICD-10-CM

## 2020-01-09 DIAGNOSIS — M6281 Muscle weakness (generalized): Secondary | ICD-10-CM

## 2020-01-09 DIAGNOSIS — M25552 Pain in left hip: Secondary | ICD-10-CM

## 2020-01-09 NOTE — Therapy (Signed)
[facility] [address] [place], [place], [address] Phone: [phone]   Fax:  [phone]  Physical Therapy Treatment  Patient Details  Name: Meghan Morales MRN: [id number] Date of Birth: May 20, 1961 Referring Provider (PT): Dorthula Nettles, DO   Encounter Date: 01/09/2020   PT End of Session - 01/09/20 1614    Visit Number 3    Number of Visits 12    Date for PT Re-Evaluation 02/11/20    Authorization Type [employer]    Authorization - Visit Number 3    Authorization - Number of Visits 60    Progress Note Due on Visit 10    PT Start Time 1604    PT Stop Time 1653    PT Time Calculation (min) 49 min    Activity Tolerance Patient tolerated treatment well    Behavior During Therapy [facility] for tasks assessed/performed           Past Medical History:  Diagnosis Date  . Arthritis    knees & back arithrits   . Diabetes mellitus without complication (HCC)   . Hypertension   . Hypothyroidism   . Urgency of urination     Past Surgical History:  Procedure Laterality Date  . CARDIAC CATHETERIZATION    . CHOLECYSTECTOMY    . KNEE SURGERY     arthroscopy  . TOTAL KNEE ARTHROPLASTY Left 06/17/2014   DR DALLDORF  . TOTAL KNEE ARTHROPLASTY Left 06/17/2014   Procedure: LEFT TOTAL KNEE ARTHROPLASTY;  Surgeon: Velna Ochs, MD;  Location: [facility] OR;  Service: Orthopedics;  Laterality: Left;  . TUBAL LIGATION      There were no vitals filed for this visit.   Subjective Assessment - 01/09/20 1607    Subjective Patient reported that she is doing alright currently.    Pertinent History History of LT TKA 2016    Diagnostic tests [place]: negative for DVT. X-rays: Negative per patient    Patient Stated Goals To resolve hip pain    Currently in Pain? No/denies                             [facility] Adult PT Treatment/Exercise - 01/09/20 0001      Exercises   Exercises Knee/Hip      Knee/Hip Exercises:  Stretches   Other Knee/Hip Stretches Gluteal stretch 3x30'' each LE. Knee to opposite shoulder.    Other Knee/Hip Stretches LTR 10x10''       Knee/Hip Exercises: Supine   Other Supine Knee/Hip Exercises Isometric hip adduction and abduction 10x10'' with ball and strap    Other Supine Knee/Hip Exercises Clams with RTB around lower thighs 15x      Manual Therapy   Manual Therapy Soft tissue mobilization    Manual therapy comments All manual completed separately from all other skilled interventions    Soft tissue mobilization STM to left and right gluteus medius and IT band IASTM with green weighted ball                    PT Short Term Goals - 01/02/20 1618      PT SHORT TERM GOAL #1   Title Patient will report understanding and regular compliance with HEP to improve strength, functional mobility, and decrease pain.    Time 3    Period Weeks    Status On-going    Target Date 01/21/20      PT SHORT  TERM GOAL #2   Title Patient will report an improvement of at least 25% in overall subjective complaint for improved QOL.    Time 3    Period Weeks    Status On-going    Target Date 01/21/20             PT Long Term Goals - 01/02/20 1618      PT LONG TERM GOAL #1   Title Patient will report an improvement of at least 50% in overall subjective complaint for improved QOL.    Time 6    Period Weeks    Status On-going      PT LONG TERM GOAL #2   Title Patient will demonstrate LT knee AROM flexion of at least 120 degrees to improve mechanics with functional mobility and decreased stress on her hips.    Time 6    Period Weeks    Status On-going      PT LONG TERM GOAL #3   Title Patient will demonstrate ability to perform SLS for at least 10 seconds indicating improved stability and ease of naviagating stairs.    Time 6    Period Weeks    Status On-going                 Plan - 01/09/20 1703    Clinical Impression Statement Progressed hip strengthening this  session. Added clamshells in supine with theraband, in supine to reduce the pressure in sidelying. Patient reported no increased pain with this. Continued manual therapy to decrease tightness and to reduce pain. Patient reported feeling good at the end of the session.    Personal Factors and Comorbidities Age;Comorbidity 3+    Comorbidities HTN, hypothyroidism, LT TKA    Examination-Activity Limitations Bed Mobility    Examination-Participation Restrictions Other   Sleep   Stability/Clinical Decision Making Stable/Uncomplicated    Rehab Potential Good    PT Frequency 2x / week    PT Duration 6 weeks    PT Treatment/Interventions ADLs/Self Care Home Management;Aquatic Therapy;Cryotherapy;Electrical Stimulation;Moist Heat;Traction;DME Instruction;Gait training;Stair training;Functional mobility training;Therapeutic activities;Therapeutic exercise;Balance training;Neuromuscular re-education;Patient/family education;Orthotic Fit/Training;Manual techniques;Scar mobilization;Passive range of motion;Dry needling;Energy conservation;Taping    PT Next Visit Plan Add core strengthening next session: supine abdominal set with march. Perform manual to decrease tightness of gluteals and IT band.    PT Home Exercise Plan 01/02/20: Gluteal stretch, hip adduction isometric and abduction    Consulted and Agree with Plan of Care Patient           Patient will benefit from skilled therapeutic intervention in order to improve the following deficits and impairments:  Abnormal gait, Pain, Improper body mechanics, Decreased mobility, Decreased activity tolerance, Decreased strength, Decreased balance, Impaired flexibility  Visit Diagnosis: Pain in right hip  Pain in left hip  Muscle weakness (generalized)  Stiffness of left knee, not elsewhere classified  Other abnormalities of gait and mobility     Problem List Patient Active Problem List   Diagnosis Date Noted  . Overactive bladder 07/25/2018  .  Menopausal syndrome 07/25/2018  . Menorrhagia 07/25/2018  . Pain in right knee 11/10/2017  . Chronic anemia 01/05/2017  . Chronic toe pain, right foot 01/05/2017  . CRP elevated 05/12/2016  . Elevated erythrocyte sedimentation rate 05/12/2016  . History of normocytic normochromic anemia 05/12/2016  . Atypical chest pain 04/22/2016  . Urge incontinence 12/02/2015  . Vitamin D deficiency 08/25/2015  . Acquired hypothyroidism 08/19/2015  . Essential hypertension 08/19/2015  . Hot flushes, perimenopausal 08/19/2015  .  Type 2 diabetes mellitus without complication, without long-term current use of insulin (HCC) 08/19/2015  . 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 Presence of right artificial knee joint 07/13/2015  . Primary osteoarthritis of left knee 06/17/2014  . Obesity 06/17/2014  . Diabetes (HCC) 06/17/2014  . Primary osteoarthritis of knee 06/17/2014   Verne Carrow PT, DPT 5:06 PM, 01/09/20 [phone]  [facility] [address] [place], [place], [address] Phone: [phone]   Fax:  [phone]  Name: Meghan Morales MRN: [id number] Date of Birth: Jun 02, 1961

## 2020-01-14 ENCOUNTER — Encounter (HOSPITAL_COMMUNITY): Payer: 59 | Admitting: Physical Therapy

## 2020-01-15 ENCOUNTER — Ambulatory Visit (HOSPITAL_COMMUNITY): Payer: 59 | Attending: Sports Medicine | Admitting: Physical Therapy

## 2020-01-15 ENCOUNTER — Encounter (HOSPITAL_COMMUNITY): Payer: Self-pay | Admitting: Physical Therapy

## 2020-01-15 ENCOUNTER — Other Ambulatory Visit: Payer: Self-pay

## 2020-01-15 ENCOUNTER — Encounter (HOSPITAL_COMMUNITY): Payer: 59 | Admitting: Physical Therapy

## 2020-01-15 DIAGNOSIS — M6281 Muscle weakness (generalized): Secondary | ICD-10-CM | POA: Insufficient documentation

## 2020-01-15 DIAGNOSIS — M25662 Stiffness of left knee, not elsewhere classified: Secondary | ICD-10-CM | POA: Diagnosis present

## 2020-01-15 DIAGNOSIS — M25552 Pain in left hip: Secondary | ICD-10-CM | POA: Diagnosis present

## 2020-01-15 DIAGNOSIS — M25551 Pain in right hip: Secondary | ICD-10-CM | POA: Diagnosis not present

## 2020-01-15 DIAGNOSIS — R2689 Other abnormalities of gait and mobility: Secondary | ICD-10-CM | POA: Insufficient documentation

## 2020-01-15 NOTE — Therapy (Signed)
[place] [address] [place], [place], [address] Phone: [phone]   Fax:  [phone]  Physical Therapy Treatment  Patient Details  Name: Meghan Morales MRN: [id number] Date of Birth: 1960-09-16 Referring Provider (PT): Dorthula Nettles, DO   Encounter Date: 01/15/2020   PT End of Session - 01/15/20 0952    Visit Number 4    Number of Visits 12    Date for PT Re-Evaluation 02/11/20    Authorization Type [employer]    Authorization - Visit Number 3    Authorization - Number of Visits 60    Progress Note Due on Visit 10    PT Start Time 0900    PT Stop Time 0940    PT Time Calculation (min) 40 min    Activity Tolerance Patient tolerated treatment well    Behavior During Therapy [facility] for tasks assessed/performed           Past Medical History:  Diagnosis Date  . Arthritis    knees & back arithrits   . Diabetes mellitus without complication (HCC)   . Hypertension   . Hypothyroidism   . Urgency of urination     Past Surgical History:  Procedure Laterality Date  . CARDIAC CATHETERIZATION    . CHOLECYSTECTOMY    . KNEE SURGERY     arthroscopy  . TOTAL KNEE ARTHROPLASTY Left 06/17/2014   DR DALLDORF  . TOTAL KNEE ARTHROPLASTY Left 06/17/2014   Procedure: LEFT TOTAL KNEE ARTHROPLASTY;  Surgeon: Velna Ochs, MD;  Location: [facility] OR;  Service: Orthopedics;  Laterality: Left;  . TUBAL LIGATION      There were no vitals filed for this visit.   Subjective Assessment - 01/15/20 0903    Subjective Patient denied any pain currently. She reported that she did not sleep well last night.    Pertinent History History of LT TKA 2016    Diagnostic tests [place]: negative for DVT. X-rays: Negative per patient    Patient Stated Goals To resolve hip pain    Currently in Pain? No/denies                             [facility] Adult PT Treatment/Exercise - 01/15/20 0001      Knee/Hip Exercises: Stretches    Other Knee/Hip Stretches Gluteal stretch 3x30'' each LE. Knee to opposite shoulder.      Knee/Hip Exercises: Supine   Bridges Strengthening;15 reps    Other Supine Knee/Hip Exercises Ab set with LE marching x20 alternating LEs 2-3 second holds. Isometric hip adduction and abduction 10x10'' with ball and strap      Knee/Hip Exercises: Sidelying   Clams 15x each LE      Manual Therapy   Manual Therapy Soft tissue mobilization    Manual therapy comments All manual completed separately from all other skilled interventions    Soft tissue mobilization STM to left and right gluteus medius and IT band IASTM with green weighted ball                    PT Short Term Goals - 01/02/20 1618      PT SHORT TERM GOAL #1   Title Patient will report understanding and regular compliance with HEP to improve strength, functional mobility, and decrease pain.    Time 3    Period Weeks    Status On-going    Target Date 01/21/20  PT SHORT TERM GOAL #2   Title Patient will report an improvement of at least 25% in overall subjective complaint for improved QOL.    Time 3    Period Weeks    Status On-going    Target Date 01/21/20             PT Long Term Goals - 01/02/20 1618      PT LONG TERM GOAL #1   Title Patient will report an improvement of at least 50% in overall subjective complaint for improved QOL.    Time 6    Period Weeks    Status On-going      PT LONG TERM GOAL #2   Title Patient will demonstrate LT knee AROM flexion of at least 120 degrees to improve mechanics with functional mobility and decreased stress on her hips.    Time 6    Period Weeks    Status On-going      PT LONG TERM GOAL #3   Title Patient will demonstrate ability to perform SLS for at least 10 seconds indicating improved stability and ease of naviagating stairs.    Time 6    Period Weeks    Status On-going                 Plan - 01/15/20 0949    Clinical Impression Statement Continued  to progress patient's core and hip strengthening this session. Added bridges with minimal cueing required. Also added supine marching with abdominal set which patient reported she had some challenge with, however, demonstrated good form with. Patient would benefit from continued skilled physical therapy to continue progressing towards functional goals.    Personal Factors and Comorbidities Age;Comorbidity 3+    Comorbidities HTN, hypothyroidism, LT TKA    Examination-Activity Limitations Bed Mobility    Examination-Participation Restrictions Other   Sleep   Stability/Clinical Decision Making Stable/Uncomplicated    Rehab Potential Good    PT Frequency 2x / week    PT Duration 6 weeks    PT Treatment/Interventions ADLs/Self Care Home Management;Aquatic Therapy;Cryotherapy;Electrical Stimulation;Moist Heat;Traction;DME Instruction;Gait training;Stair training;Functional mobility training;Therapeutic activities;Therapeutic exercise;Balance training;Neuromuscular re-education;Patient/family education;Orthotic Fit/Training;Manual techniques;Scar mobilization;Passive range of motion;Dry needling;Energy conservation;Taping    PT Next Visit Plan Progress to standing next session. Add functional squats. Trial sidestepping with band.    PT Home Exercise Plan 01/02/20: Gluteal stretch, hip adduction isometric and abduction    Consulted and Agree with Plan of Care Patient           Patient will benefit from skilled therapeutic intervention in order to improve the following deficits and impairments:  Abnormal gait, Pain, Improper body mechanics, Decreased mobility, Decreased activity tolerance, Decreased strength, Decreased balance, Impaired flexibility  Visit Diagnosis: Pain in right hip  Pain in left hip  Muscle weakness (generalized)  Stiffness of left knee, not elsewhere classified  Other abnormalities of gait and mobility     Problem List Patient Active Problem List   Diagnosis Date Noted    . Overactive bladder 07/25/2018  . Menopausal syndrome 07/25/2018  . Menorrhagia 07/25/2018  . Pain in right knee 11/10/2017  . Chronic anemia 01/05/2017  . Chronic toe pain, right foot 01/05/2017  . CRP elevated 05/12/2016  . Elevated erythrocyte sedimentation rate 05/12/2016  . History of normocytic normochromic anemia 05/12/2016  . Atypical chest pain 04/22/2016  . Urge incontinence 12/02/2015  . Vitamin D deficiency 08/25/2015  . Acquired hypothyroidism 08/19/2015  . Essential hypertension 08/19/2015  . Hot flushes, perimenopausal 08/19/2015  .  Type 2 diabetes mellitus without complication, without long-term current use of insulin (HCC) 08/19/2015  . 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 Presence of right artificial knee joint 07/13/2015  . Primary osteoarthritis of left knee 06/17/2014  . Obesity 06/17/2014  . Diabetes (HCC) 06/17/2014  . Primary osteoarthritis of knee 06/17/2014    Verne Carrow 01/15/2020, 9:52 AM  [facility] [address] [place], [place], [address] Phone: [phone]   Fax:  [phone]  Name: Meghan Morales MRN: [id number] Date of Birth: 1960/10/08

## 2020-01-17 ENCOUNTER — Other Ambulatory Visit: Payer: Self-pay

## 2020-01-17 ENCOUNTER — Ambulatory Visit (HOSPITAL_COMMUNITY): Payer: 59 | Admitting: Physical Therapy

## 2020-01-17 ENCOUNTER — Encounter (HOSPITAL_COMMUNITY): Payer: Self-pay | Admitting: Physical Therapy

## 2020-01-17 DIAGNOSIS — M25551 Pain in right hip: Secondary | ICD-10-CM | POA: Diagnosis not present

## 2020-01-17 DIAGNOSIS — R2689 Other abnormalities of gait and mobility: Secondary | ICD-10-CM

## 2020-01-17 DIAGNOSIS — M25662 Stiffness of left knee, not elsewhere classified: Secondary | ICD-10-CM

## 2020-01-17 DIAGNOSIS — M6281 Muscle weakness (generalized): Secondary | ICD-10-CM

## 2020-01-17 DIAGNOSIS — M25552 Pain in left hip: Secondary | ICD-10-CM

## 2020-01-17 NOTE — Therapy (Signed)
[facility] [address] [place], [place], [address] Phone: [phone]   Fax:  [phone]  Physical Therapy Treatment  Patient Details  Name: Meghan Morales MRN: [id number] Date of Birth: [birth year] Referring Provider (PT): Dorthula Nettles, DO   Encounter Date: 01/17/2020   PT End of Session - 01/17/20 1448    Visit Number 5    Number of Visits 12    Date for PT Re-Evaluation 02/11/20    Authorization Type [employer]    Authorization - Visit Number 4    Authorization - Number of Visits 60    Progress Note Due on Visit 10    PT Start Time 1436    PT Stop Time 1515    PT Time Calculation (min) 39 min    Activity Tolerance Patient tolerated treatment well    Behavior During Therapy [facility] for tasks assessed/performed           Past Medical History:  Diagnosis Date  . Arthritis    knees & back arithrits   . Diabetes mellitus without complication (HCC)   . Hypertension   . Hypothyroidism   . Urgency of urination     Past Surgical History:  Procedure Laterality Date  . CARDIAC CATHETERIZATION    . CHOLECYSTECTOMY    . KNEE SURGERY     arthroscopy  . TOTAL KNEE ARTHROPLASTY Left 06/17/2014   DR DALLDORF  . TOTAL KNEE ARTHROPLASTY Left 06/17/2014   Procedure: LEFT TOTAL KNEE ARTHROPLASTY;  Surgeon: Velna Ochs, MD;  Location: [facility] OR;  Service: Orthopedics;  Laterality: Left;  . TUBAL LIGATION      There were no vitals filed for this visit.   Subjective Assessment - 01/17/20 1446    Subjective Patient reported she is still having hip pain at night, but not having any right now.    Pertinent History History of LT TKA 2016    Diagnostic tests [place]: negative for DVT. X-rays: Negative per patient    Patient Stated Goals To resolve hip pain    Currently in Pain? No/denies                             [facility] Adult PT Treatment/Exercise - 01/17/20 0001      Knee/Hip Exercises: Stretches    Other Knee/Hip Stretches Gluteal stretch 3x30'' each LE. Knee to opposite shoulder.      Knee/Hip Exercises: Standing   Functional Squat 15 reps;1 set    Functional Squat Limitations BUE assistance    Other Standing Knee Exercises Sidestepping with RTB 15' x 1 RT      Knee/Hip Exercises: Supine   Other Supine Knee/Hip Exercises Ab set with LE marching x20 alternating LEs 2-3 second holds.       Knee/Hip Exercises: Sidelying   Clams 15x each LE      Manual Therapy   Manual Therapy Soft tissue mobilization    Manual therapy comments All manual completed separately from all other skilled interventions    Soft tissue mobilization STM to left and right gluteus medius and IT band IASTM with green weighted ball                    PT Short Term Goals - 01/02/20 1618      PT SHORT TERM GOAL #1   Title Patient will report understanding and regular compliance with HEP to improve strength, functional mobility, and decrease pain.  Time 3    Period Weeks    Status On-going    Target Date 01/21/20      PT SHORT TERM GOAL #2   Title Patient will report an improvement of at least 25% in overall subjective complaint for improved QOL.    Time 3    Period Weeks    Status On-going    Target Date 01/21/20             PT Long Term Goals - 01/02/20 1618      PT LONG TERM GOAL #1   Title Patient will report an improvement of at least 50% in overall subjective complaint for improved QOL.    Time 6    Period Weeks    Status On-going      PT LONG TERM GOAL #2   Title Patient will demonstrate LT knee AROM flexion of at least 120 degrees to improve mechanics with functional mobility and decreased stress on her hips.    Time 6    Period Weeks    Status On-going      PT LONG TERM GOAL #3   Title Patient will demonstrate ability to perform SLS for at least 10 seconds indicating improved stability and ease of naviagating stairs.    Time 6    Period Weeks    Status On-going                  Plan - 01/17/20 1535    Clinical Impression Statement Progressed to standing exercises this session. Added sidestepping with Red band which patient was well challenged with. Provided min guard with this due to patient reporting some difficulty. Also added functional squats this session. Patient required only minimal cueing for proper form with this.    Personal Factors and Comorbidities Age;Comorbidity 3+    Comorbidities HTN, hypothyroidism, LT TKA    Examination-Activity Limitations Bed Mobility    Examination-Participation Restrictions Other   Sleep   Stability/Clinical Decision Making Stable/Uncomplicated    Rehab Potential Good    PT Frequency 2x / week    PT Duration 6 weeks    PT Treatment/Interventions ADLs/Self Care Home Management;Aquatic Therapy;Cryotherapy;Electrical Stimulation;Moist Heat;Traction;DME Instruction;Gait training;Stair training;Functional mobility training;Therapeutic activities;Therapeutic exercise;Balance training;Neuromuscular re-education;Patient/family education;Orthotic Fit/Training;Manual techniques;Scar mobilization;Passive range of motion;Dry needling;Energy conservation;Taping    PT Next Visit Plan Trial step ups next session. Add squats to HEP as able.    PT Home Exercise Plan 01/02/20: Gluteal stretch, hip adduction isometric and abduction    Consulted and Agree with Plan of Care Patient           Patient will benefit from skilled therapeutic intervention in order to improve the following deficits and impairments:  Abnormal gait, Pain, Improper body mechanics, Decreased mobility, Decreased activity tolerance, Decreased strength, Decreased balance, Impaired flexibility  Visit Diagnosis: Pain in right hip  Pain in left hip  Muscle weakness (generalized)  Stiffness of left knee, not elsewhere classified  Other abnormalities of gait and mobility     Problem List Patient Active Problem List   Diagnosis Date Noted  . Overactive  bladder 07/25/2018  . Menopausal syndrome 07/25/2018  . Menorrhagia 07/25/2018  . Pain in right knee 11/10/2017  . Chronic anemia 01/05/2017  . Chronic toe pain, right foot 01/05/2017  . CRP elevated 05/12/2016  . Elevated erythrocyte sedimentation rate 05/12/2016  . History of normocytic normochromic anemia 05/12/2016  . Atypical chest pain 04/22/2016  . Urge incontinence 12/02/2015  . Vitamin D deficiency 08/25/2015  .  Acquired hypothyroidism 08/19/2015  . Essential hypertension 08/19/2015  . Hot flushes, perimenopausal 08/19/2015  . Type 2 diabetes mellitus without complication, without long-term current use of insulin (HCC) 08/19/2015  . 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 Presence of right artificial knee joint 07/13/2015  . Primary osteoarthritis of left knee 06/17/2014  . Obesity 06/17/2014  . Diabetes (HCC) 06/17/2014  . Primary osteoarthritis of knee 06/17/2014   Verne Carrow PT, DPT 3:38 PM, 01/17/20 [phone]  [facility] [address] [place], [place], [address] Phone: [phone]   Fax:  [phone]  Name: Meghan Morales MRN: [id number] Date of Birth: 05/20/1961

## 2020-01-20 ENCOUNTER — Other Ambulatory Visit: Payer: Self-pay

## 2020-01-20 ENCOUNTER — Encounter (HOSPITAL_COMMUNITY): Payer: Self-pay | Admitting: Physical Therapy

## 2020-01-20 ENCOUNTER — Ambulatory Visit (HOSPITAL_COMMUNITY): Payer: 59 | Admitting: Physical Therapy

## 2020-01-20 DIAGNOSIS — M25551 Pain in right hip: Secondary | ICD-10-CM | POA: Diagnosis not present

## 2020-01-20 DIAGNOSIS — M6281 Muscle weakness (generalized): Secondary | ICD-10-CM

## 2020-01-20 DIAGNOSIS — R2689 Other abnormalities of gait and mobility: Secondary | ICD-10-CM

## 2020-01-20 DIAGNOSIS — M25662 Stiffness of left knee, not elsewhere classified: Secondary | ICD-10-CM

## 2020-01-20 DIAGNOSIS — M25552 Pain in left hip: Secondary | ICD-10-CM

## 2020-01-20 NOTE — Patient Instructions (Signed)
Access Code: [email or web address] URL: [email or web address] Date: 01/20/2020 Prepared by: Greig Castilla Dominique Calvey  Exercises Step Up - 1 x daily - 7 x weekly - 2 sets - 10 reps Lateral Step Down - 1 x daily - 7 x weekly - 2 sets - 10 reps

## 2020-01-20 NOTE — Therapy (Signed)
[place] [facility] [address] [place], [place], [address] Phone: [phone]   Fax:  [phone]  Physical Therapy Treatment  Patient Details  Name: Meghan Morales MRN: [id number] Date of Birth: 1960-08-16 Referring Provider (PT): Dorthula Nettles, DO   Encounter Date: 01/20/2020   PT End of Session - 01/20/20 0815    Visit Number 6    Number of Visits 12    Date for PT Re-Evaluation 02/11/20    Authorization Type [employer]    Authorization - Visit Number 6    Authorization - Number of Visits 60    Progress Note Due on Visit 10    PT Start Time 0815    PT Stop Time 0855    PT Time Calculation (min) 40 min    Activity Tolerance Patient tolerated treatment well    Behavior During Therapy [facility] for tasks assessed/performed           Past Medical History:  Diagnosis Date  . Arthritis    knees & back arithrits   . Diabetes mellitus without complication (HCC)   . Hypertension   . Hypothyroidism   . Urgency of urination     Past Surgical History:  Procedure Laterality Date  . CARDIAC CATHETERIZATION    . CHOLECYSTECTOMY    . KNEE SURGERY     arthroscopy  . TOTAL KNEE ARTHROPLASTY Left 06/17/2014   DR DALLDORF  . TOTAL KNEE ARTHROPLASTY Left 06/17/2014   Procedure: LEFT TOTAL KNEE ARTHROPLASTY;  Surgeon: Velna Ochs, MD;  Location: [facility] OR;  Service: Orthopedics;  Laterality: Left;  . TUBAL LIGATION      There were no vitals filed for this visit.   Subjective Assessment - 01/20/20 0814    Subjective Patient states her hips are going ok. Her left hip bothers her more and still painful with lying on it. She thinks what we have been working on has been helpful.    Pertinent History History of LT TKA 2016    Diagnostic tests [place]: negative for DVT. X-rays: Negative per patient    Patient Stated Goals To resolve hip pain    Currently in Pain? No/denies                             [facility] Adult PT  Treatment/Exercise - 01/20/20 0001      Knee/Hip Exercises: Stretches   Other Knee/Hip Stretches Gluteal stretch 3x30'' each LE. Knee to opposite shoulder.      Knee/Hip Exercises: Standing   Hip Abduction 15 reps;Both;1 set    Lateral Step Up Both;2 sets;10 reps;Hand Hold: 2;Step Height: 4"    Lateral Step Up Limitations eccentric control     Forward Step Up Both;1 set;10 reps;Hand Hold: 2;Step Height: 6"    Other Standing Knee Exercises Sidestepping with RTB 15' x 2 RT      Knee/Hip Exercises: Supine   Bridges Strengthening;15 reps;2 sets      Manual Therapy   Manual Therapy Soft tissue mobilization    Manual therapy comments All manual completed separately from all other skilled interventions    Soft tissue mobilization STM to left and right gluteus medius and IT band IASTM with green weighted ball                  PT Education - 01/20/20 0815    Education Details Educated on HEP, Geologist, engineering of exercise    Person(s) Educated Patient  Methods Explanation;Demonstration    Comprehension Verbalized understanding;Returned demonstration            PT Short Term Goals - 01/02/20 1618      PT SHORT TERM GOAL #1   Title Patient will report understanding and regular compliance with HEP to improve strength, functional mobility, and decrease pain.    Time 3    Period Weeks    Status On-going    Target Date 01/21/20      PT SHORT TERM GOAL #2   Title Patient will report an improvement of at least 25% in overall subjective complaint for improved QOL.    Time 3    Period Weeks    Status On-going    Target Date 01/21/20             PT Long Term Goals - 01/02/20 1618      PT LONG TERM GOAL #1   Title Patient will report an improvement of at least 50% in overall subjective complaint for improved QOL.    Time 6    Period Weeks    Status On-going      PT LONG TERM GOAL #2   Title Patient will demonstrate LT knee AROM flexion of at least 120 degrees to improve  mechanics with functional mobility and decreased stress on her hips.    Time 6    Period Weeks    Status On-going      PT LONG TERM GOAL #3   Title Patient will demonstrate ability to perform SLS for at least 10 seconds indicating improved stability and ease of naviagating stairs.    Time 6    Period Weeks    Status On-going                 Plan - 01/20/20 0815    Clinical Impression Statement Patient states L knee pain with exercises today but is able to complete. Patient requires bilateral UE support with stairs initially but is able to complete with unilateral UE support with cueing. Patient demonstrates good eccentric control with lateral step down but requires minimal bilateral UE support. Patient able to complete lateral stepping with mini squat today and fatigues quickly in hip musculature but is able to continue with good mechanics. She requires bilateral UE support with hip abduction exercise as well as cueing for glute activation on stance leg. Patient will continue to benefit from skilled physical therapy in order to reduce impairment and improve function.    Personal Factors and Comorbidities Age;Comorbidity 3+    Comorbidities HTN, hypothyroidism, LT TKA    Examination-Activity Limitations Bed Mobility    Examination-Participation Restrictions Other   Sleep   Stability/Clinical Decision Making Stable/Uncomplicated    Rehab Potential Good    PT Frequency 2x / week    PT Duration 6 weeks    PT Treatment/Interventions ADLs/Self Care Home Management;Aquatic Therapy;Cryotherapy;Electrical Stimulation;Moist Heat;Traction;DME Instruction;Gait training;Stair training;Functional mobility training;Therapeutic activities;Therapeutic exercise;Balance training;Neuromuscular re-education;Patient/family education;Orthotic Fit/Training;Manual techniques;Scar mobilization;Passive range of motion;Dry needling;Energy conservation;Taping    PT Next Visit Plan continue hip abductor and  extensor strengtheing.  Add squats to HEP as able.    PT Home Exercise Plan 01/02/20: Gluteal stretch, hip adduction isometric and abduction    Consulted and Agree with Plan of Care Patient           Patient will benefit from skilled therapeutic intervention in order to improve the following deficits and impairments:  Abnormal gait, Pain, Improper body mechanics, Decreased mobility, Decreased activity tolerance,  Decreased strength, Decreased balance, Impaired flexibility  Visit Diagnosis: Pain in right hip  Pain in left hip  Muscle weakness (generalized)  Stiffness of left knee, not elsewhere classified  Other abnormalities of gait and mobility     Problem List Patient Active Problem List   Diagnosis Date Noted  . Overactive bladder 07/25/2018  . Menopausal syndrome 07/25/2018  . Menorrhagia 07/25/2018  . Pain in right knee 11/10/2017  . Chronic anemia 01/05/2017  . Chronic toe pain, right foot 01/05/2017  . CRP elevated 05/12/2016  . Elevated erythrocyte sedimentation rate 05/12/2016  . History of normocytic normochromic anemia 05/12/2016  . Atypical chest pain 04/22/2016  . Urge incontinence 12/02/2015  . Vitamin D deficiency 08/25/2015  . Acquired hypothyroidism 08/19/2015  . Essential hypertension 08/19/2015  . Hot flushes, perimenopausal 08/19/2015  . Type 2 diabetes mellitus without complication, without long-term current use of insulin (HCC) 08/19/2015  . 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 Presence of right artificial knee joint 07/13/2015  . Primary osteoarthritis of left knee 06/17/2014  . Obesity 06/17/2014  . Diabetes (HCC) 06/17/2014  . Primary osteoarthritis of knee 06/17/2014    8:58 AM, 01/20/20 Wyman Songster PT, DPT Physical Therapist at [facility]  [place] [facility] [address] [place], [place], [address] Phone: [phone]   Fax:   [phone]  Name: Meghan Morales MRN: [id number] Date of Birth: 02-11-1961

## 2020-01-22 ENCOUNTER — Ambulatory Visit (HOSPITAL_COMMUNITY): Payer: 59 | Admitting: Physical Therapy

## 2020-01-22 ENCOUNTER — Other Ambulatory Visit: Payer: Self-pay

## 2020-01-22 DIAGNOSIS — M25551 Pain in right hip: Secondary | ICD-10-CM

## 2020-01-22 DIAGNOSIS — R2689 Other abnormalities of gait and mobility: Secondary | ICD-10-CM

## 2020-01-22 DIAGNOSIS — M25552 Pain in left hip: Secondary | ICD-10-CM

## 2020-01-22 DIAGNOSIS — M25662 Stiffness of left knee, not elsewhere classified: Secondary | ICD-10-CM

## 2020-01-22 DIAGNOSIS — M6281 Muscle weakness (generalized): Secondary | ICD-10-CM

## 2020-01-22 NOTE — Therapy (Signed)
[place] [facility] [address] [place], [place], [address] Phone: [phone]   Fax:  [phone]  Physical Therapy Treatment  Patient Details  Name: Meghan Morales MRN: [id number] Date of Birth: Jul 05, 1960 Referring Provider (PT): Dorthula Nettles, DO   Encounter Date: 01/22/2020   PT End of Session - 01/22/20 1642    Visit Number 7    Number of Visits 12    Date for PT Re-Evaluation 02/11/20    Authorization Type [employer]    Authorization - Visit Number 7    Authorization - Number of Visits 60    Progress Note Due on Visit 10    PT Start Time 1620    PT Stop Time 1700    PT Time Calculation (min) 40 min    Activity Tolerance Patient tolerated treatment well    Behavior During Therapy [facility] for tasks assessed/performed           Past Medical History:  Diagnosis Date  . Arthritis    knees & back arithrits   . Diabetes mellitus without complication (HCC)   . Hypertension   . Hypothyroidism   . Urgency of urination     Past Surgical History:  Procedure Laterality Date  . CARDIAC CATHETERIZATION    . CHOLECYSTECTOMY    . KNEE SURGERY     arthroscopy  . TOTAL KNEE ARTHROPLASTY Left 06/17/2014   DR DALLDORF  . TOTAL KNEE ARTHROPLASTY Left 06/17/2014   Procedure: LEFT TOTAL KNEE ARTHROPLASTY;  Surgeon: Velna Ochs, MD;  Location: [facility] OR;  Service: Orthopedics;  Laterality: Left;  . TUBAL LIGATION      There were no vitals filed for this visit.   Subjective Assessment - 01/22/20 1623    Subjective PT states that she is doing alright with her exercises    Pertinent History History of LT TKA 2016    Diagnostic tests [place]: negative for DVT. X-rays: Negative per patient    Patient Stated Goals To resolve hip pain    Currently in Pain? No/denies                             [facility] Adult PT Treatment/Exercise - 01/22/20 0001      Exercises   Exercises Knee/Hip      Knee/Hip Exercises:  Standing   Lateral Step Up Both;2 sets;10 reps;Hand Hold: 2;Step Height: 4"    Forward Step Up Both;1 set;10 reps;Hand Hold: 2;Step Height: 6"    Rocker Board 2 minutes    SLS x 5 B     Other Standing Knee Exercises Sidestepping with RTB 15' x 2 RT      Knee/Hip Exercises: Seated   Sit to Sand 10 reps         functional squat x 10            PT Short Term Goals - 01/02/20 1618      PT SHORT TERM GOAL #1   Title Patient will report understanding and regular compliance with HEP to improve strength, functional mobility, and decrease pain.    Time 3    Period Weeks    Status On-going    Target Date 01/21/20      PT SHORT TERM GOAL #2   Title Patient will report an improvement of at least 25% in overall subjective complaint for improved QOL.    Time 3    Period Weeks    Status  On-going    Target Date 01/21/20             PT Long Term Goals - 01/02/20 1618      PT LONG TERM GOAL #1   Title Patient will report an improvement of at least 50% in overall subjective complaint for improved QOL.    Time 6    Period Weeks    Status On-going      PT LONG TERM GOAL #2   Title Patient will demonstrate LT knee AROM flexion of at least 120 degrees to improve mechanics with functional mobility and decreased stress on her hips.    Time 6    Period Weeks    Status On-going      PT LONG TERM GOAL #3   Title Patient will demonstrate ability to perform SLS for at least 10 seconds indicating improved stability and ease of naviagating stairs.    Time 6    Period Weeks    Status On-going                 Plan - 01/22/20 1642    Clinical Impression Statement Added single leg stance for balance and sit to stand slowly to address glut max weakness.  PT able to complete all exercises with good form with verbal cuingl  Pt will continue to benefit from skilled PT to improve balance and stength to improve functional ability    Personal Factors and Comorbidities Age;Comorbidity  3+    Comorbidities HTN, hypothyroidism, LT TKA    Examination-Activity Limitations Bed Mobility    Examination-Participation Restrictions Other   Sleep   Stability/Clinical Decision Making Stable/Uncomplicated    Rehab Potential Good    PT Frequency 2x / week    PT Duration 6 weeks    PT Treatment/Interventions ADLs/Self Care Home Management;Aquatic Therapy;Cryotherapy;Electrical Stimulation;Moist Heat;Traction;DME Instruction;Gait training;Stair training;Functional mobility training;Therapeutic activities;Therapeutic exercise;Balance training;Neuromuscular re-education;Patient/family education;Orthotic Fit/Training;Manual techniques;Scar mobilization;Passive range of motion;Dry needling;Energy conservation;Taping    PT Next Visit Plan continue hip abductor and extensor strengtheing. .    PT Home Exercise Plan 01/02/20: Gluteal stretch, hip adduction isometric and abduction    Consulted and Agree with Plan of Care Patient           Patient will benefit from skilled therapeutic intervention in order to improve the following deficits and impairments:  Abnormal gait, Pain, Improper body mechanics, Decreased mobility, Decreased activity tolerance, Decreased strength, Decreased balance, Impaired flexibility  Visit Diagnosis: Pain in right hip  Muscle weakness (generalized)  Pain in left hip  Stiffness of left knee, not elsewhere classified  Other abnormalities of gait and mobility     Problem List Patient Active Problem List   Diagnosis Date Noted  . Overactive bladder 07/25/2018  . Menopausal syndrome 07/25/2018  . Menorrhagia 07/25/2018  . Pain in right knee 11/10/2017  . Chronic anemia 01/05/2017  . Chronic toe pain, right foot 01/05/2017  . CRP elevated 05/12/2016  . Elevated erythrocyte sedimentation rate 05/12/2016  . History of normocytic normochromic anemia 05/12/2016  . Atypical chest pain 04/22/2016  . Urge incontinence 12/02/2015  . Vitamin D deficiency 08/25/2015   . Acquired hypothyroidism 08/19/2015  . Essential hypertension 08/19/2015  . Hot flushes, perimenopausal 08/19/2015  . Type 2 diabetes mellitus without complication, without long-term current use of insulin (HCC) 08/19/2015  . 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 Presence of right artificial knee joint 07/13/2015  . Primary osteoarthritis of left knee 06/17/2014  . Obesity 06/17/2014  .  Diabetes (HCC) 06/17/2014  . Primary osteoarthritis of knee 06/17/2014   Virgina Organ, PT CLT [phone] 01/22/2020, 5:02 PM  [facility] [address] [place], [place], [address] Phone: [phone]   Fax:  [phone]  Name: Hartley Wyke MRN: [id number] Date of Birth: 10-12-1960

## 2020-01-29 ENCOUNTER — Other Ambulatory Visit: Payer: Self-pay

## 2020-01-29 ENCOUNTER — Ambulatory Visit (HOSPITAL_COMMUNITY): Payer: 59

## 2020-01-29 ENCOUNTER — Encounter (HOSPITAL_COMMUNITY): Payer: Self-pay

## 2020-01-29 DIAGNOSIS — M6281 Muscle weakness (generalized): Secondary | ICD-10-CM

## 2020-01-29 DIAGNOSIS — M25552 Pain in left hip: Secondary | ICD-10-CM

## 2020-01-29 DIAGNOSIS — M25551 Pain in right hip: Secondary | ICD-10-CM | POA: Diagnosis not present

## 2020-01-29 NOTE — Therapy (Signed)
[place] [facility] [address] [place], [place], [address] Phone: [phone]   Fax:  [phone]  Physical Therapy Treatment  Patient Details  Name: Meghan Morales MRN: [id number] Date of Birth: [birth year] Referring Provider (PT): Dorthula Nettles, DO   Encounter Date: 01/29/2020   PT End of Session - 01/29/20 0836    Visit Number 8    Number of Visits 12    Date for PT Re-Evaluation 02/11/20    Authorization Type [employer]    Authorization - Visit Number 8    Authorization - Number of Visits 60    Progress Note Due on Visit 10    PT Start Time [phone]    PT Stop Time 0912    PT Time Calculation (min) 39 min    Activity Tolerance Patient tolerated treatment well    Behavior During Therapy [facility] for tasks assessed/performed           Past Medical History:  Diagnosis Date  . Arthritis    knees & back arithrits   . Diabetes mellitus without complication (HCC)   . Hypertension   . Hypothyroidism   . Urgency of urination     Past Surgical History:  Procedure Laterality Date  . CARDIAC CATHETERIZATION    . CHOLECYSTECTOMY    . KNEE SURGERY     arthroscopy  . TOTAL KNEE ARTHROPLASTY Left 06/17/2014   DR DALLDORF  . TOTAL KNEE ARTHROPLASTY Left 06/17/2014   Procedure: LEFT TOTAL KNEE ARTHROPLASTY;  Surgeon: Velna Ochs, MD;  Location: [facility] OR;  Service: Orthopedics;  Laterality: Left;  . TUBAL LIGATION      There were no vitals filed for this visit.   Subjective Assessment - 01/29/20 0835    Subjective Pt stated she is feeling good, no reports of pain.  Reports most difficulty with discomfort laying on her side at night.    Pertinent History History of LT TKA 2016    Patient Stated Goals To resolve hip pain    Currently in Pain? No/denies                             [facility] Adult PT Treatment/Exercise - 01/29/20 0001      Knee/Hip Exercises: Standing   Forward Lunges Both;15 reps    Forward  Lunges Limitations onto 6in step no HHA    Lateral Step Up 15 reps;Both;Hand Hold: 2;Step Height: 6"    Functional Squat 15 reps    Functional Squat Limitations no HHA, front of chair for mechanics    Stairs 5RT 7in step height 1 HR A    Rocker Board 2 minutes    SLS with Vectors 5x5" BLE with HHA    Other Standing Knee Exercises Sidestepping with RTB 15' x 2 RT      Knee/Hip Exercises: Seated   Sit to Sand 10 reps;without UE support   eccentric control                   PT Short Term Goals - 01/02/20 1618      PT SHORT TERM GOAL #1   Title Patient will report understanding and regular compliance with HEP to improve strength, functional mobility, and decrease pain.    Time 3    Period Weeks    Status On-going    Target Date 01/21/20      PT SHORT TERM GOAL #2   Title Patient will report  an improvement of at least 25% in overall subjective complaint for improved QOL.    Time 3    Period Weeks    Status On-going    Target Date 01/21/20             PT Long Term Goals - 01/02/20 1618      PT LONG TERM GOAL #1   Title Patient will report an improvement of at least 50% in overall subjective complaint for improved QOL.    Time 6    Period Weeks    Status On-going      PT LONG TERM GOAL #2   Title Patient will demonstrate LT knee AROM flexion of at least 120 degrees to improve mechanics with functional mobility and decreased stress on her hips.    Time 6    Period Weeks    Status On-going      PT LONG TERM GOAL #3   Title Patient will demonstrate ability to perform SLS for at least 10 seconds indicating improved stability and ease of naviagating stairs.    Time 6    Period Weeks    Status On-going                 Plan - 01/29/20 0906    Clinical Impression Statement Session focus with gluteal strengthening with additional vector stance and forward lunges.  Pt able to complete all exercises with good form following demonstration and verbal cueing.     Personal Factors and Comorbidities Age;Comorbidity 3+    Comorbidities HTN, hypothyroidism, LT TKA    Examination-Activity Limitations Bed Mobility    Examination-Participation Restrictions Other   Sleep   Stability/Clinical Decision Making Stable/Uncomplicated    Clinical Decision Making Low    Rehab Potential Good    PT Frequency 2x / week    PT Duration 6 weeks    PT Treatment/Interventions ADLs/Self Care Home Management;Aquatic Therapy;Cryotherapy;Electrical Stimulation;Moist Heat;Traction;DME Instruction;Gait training;Stair training;Functional mobility training;Therapeutic activities;Therapeutic exercise;Balance training;Neuromuscular re-education;Patient/family education;Orthotic Fit/Training;Manual techniques;Scar mobilization;Passive range of motion;Dry needling;Energy conservation;Taping    PT Next Visit Plan continue hip abductor and extensor strengtheing. .    PT Home Exercise Plan 01/02/20: Gluteal stretch, hip adduction isometric and abduction           Patient will benefit from skilled therapeutic intervention in order to improve the following deficits and impairments:  Abnormal gait, Pain, Improper body mechanics, Decreased mobility, Decreased activity tolerance, Decreased strength, Decreased balance, Impaired flexibility  Visit Diagnosis: Pain in right hip  Muscle weakness (generalized)  Pain in left hip     Problem List Patient Active Problem List   Diagnosis Date Noted  . Overactive bladder 07/25/2018  . Menopausal syndrome 07/25/2018  . Menorrhagia 07/25/2018  . Pain in right knee 11/10/2017  . Chronic anemia 01/05/2017  . Chronic toe pain, right foot 01/05/2017  . CRP elevated 05/12/2016  . Elevated erythrocyte sedimentation rate 05/12/2016  . History of normocytic normochromic anemia 05/12/2016  . Atypical chest pain 04/22/2016  . Urge incontinence 12/02/2015  . Vitamin D deficiency 08/25/2015  . Acquired hypothyroidism 08/19/2015  . Essential  hypertension 08/19/2015  . Hot flushes, perimenopausal 08/19/2015  . Type 2 diabetes mellitus without complication, without long-term current use of insulin (HCC) 08/19/2015  . 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 Presence of right artificial knee joint 07/13/2015  . Primary osteoarthritis of left knee 06/17/2014  . Obesity 06/17/2014  . Diabetes (HCC) 06/17/2014  . Primary osteoarthritis of knee 06/17/2014  Becky Sax, LPTA/CLT; CBIS [phone]  Juel Burrow 01/29/2020, 9:11 AM  [place] [facility] [address] [place], [place], [address] Phone: [phone]   Fax:  [phone]  Name: Meghan Morales MRN: [id number] Date of Birth: [birth year]

## 2020-01-31 ENCOUNTER — Ambulatory Visit (HOSPITAL_COMMUNITY): Payer: 59

## 2020-01-31 ENCOUNTER — Other Ambulatory Visit: Payer: Self-pay

## 2020-01-31 ENCOUNTER — Encounter (HOSPITAL_COMMUNITY): Payer: Self-pay

## 2020-01-31 DIAGNOSIS — M25551 Pain in right hip: Secondary | ICD-10-CM | POA: Diagnosis not present

## 2020-01-31 DIAGNOSIS — M25552 Pain in left hip: Secondary | ICD-10-CM

## 2020-01-31 DIAGNOSIS — M6281 Muscle weakness (generalized): Secondary | ICD-10-CM

## 2020-01-31 NOTE — Patient Instructions (Signed)
Band Walk: Side Stepping    Tie band around legs, just above knees. Step 10 feet to one side, then step back to start. Note: Small towel between band and skin eases rubbing.  http://plyo.exer.us/76   Copyright  VHI. All rights reserved.   Single Leg Balance: Eyes Open    Stand on right leg with eyes open. Hold ___ seconds. ___ reps ___ times per day.  http://ggbe.exer.us/5   Copyright  VHI. All rights reserved.

## 2020-02-01 NOTE — Therapy (Signed)
[facility] [address] [place], [place], [address] Phone: [phone]   Fax:  [phone]  Physical Therapy Treatment  Patient Details  Name: Meghan Morales MRN: [id number] Date of Birth: [birth year] Referring Provider (PT): Dorthula Nettles, DO   Encounter Date: 01/31/2020   PT End of Session - 01/31/20 0836    Visit Number 9    Number of Visits 12    Date for PT Re-Evaluation 02/11/20    Authorization Type [employer]    Authorization - Visit Number 9    Authorization - Number of Visits 60    Progress Note Due on Visit 10    PT Start Time [phone]    PT Stop Time 0916    PT Time Calculation (min) 44 min    Activity Tolerance Patient tolerated treatment well    Behavior During Therapy [facility] for tasks assessed/performed           Past Medical History:  Diagnosis Date  . Arthritis    knees & back arithrits   . Diabetes mellitus without complication (HCC)   . Hypertension   . Hypothyroidism   . Urgency of urination     Past Surgical History:  Procedure Laterality Date  . CARDIAC CATHETERIZATION    . CHOLECYSTECTOMY    . KNEE SURGERY     arthroscopy  . TOTAL KNEE ARTHROPLASTY Left 06/17/2014   DR DALLDORF  . TOTAL KNEE ARTHROPLASTY Left 06/17/2014   Procedure: LEFT TOTAL KNEE ARTHROPLASTY;  Surgeon: Velna Ochs, MD;  Location: [facility] OR;  Service: Orthopedics;  Laterality: Left;  . TUBAL LIGATION      There were no vitals filed for this visit.   Subjective Assessment - 01/31/20 0834    Subjective Pt reports some soreness following last session, no reports of pain currently.  Continues to have discomfort laying on her left side at night    Pertinent History History of LT TKA 2016    Patient Stated Goals To resolve hip pain    Currently in Pain? No/denies               01/31/20 0001  Exercises  Exercises Knee/Hip  Knee/Hip Exercises: Standing  Heel Raises 15 reps  Forward Lunges Both;15 reps    Forward Lunges Limitations onto 6in step no HHA  Side Lunges Both;10 reps  Side Lunges Limitations 2nd step (8in height)  Functional Squat 15 reps  Functional Squat Limitations no HHA, front of chair for mechanics  Stairs 5RT 7in step height 1 HR A  SLS Rt 14", Lt 16" max of 5  SLS with Vectors 5x5" BLE with HHA  Other Standing Knee Exercises Sidestep 1Rt knee straight 2nd set in minisquat with RTB around thigh  Knee/Hip Exercises: Seated  Sit to Sand 10 reps;without UE support (eccentric control)  Knee/Hip Exercises: Supine  Knee Flexion AROM;Left  Knee Flexion Limitations 112  Manual Therapy  Manual Therapy Soft tissue mobilization  Manual therapy comments All manual completed separately from all other skilled interventions  Soft tissue mobilization STM to left gluteus medius and IT band IASTM with green weighted ball             PT Short Term Goals - 01/02/20 1618      PT SHORT TERM GOAL #1   Title Patient will report understanding and regular compliance with HEP to improve strength, functional mobility, and decrease pain.    Time 3    Period Weeks  Status On-going    Target Date 01/21/20      PT SHORT TERM GOAL #2   Title Patient will report an improvement of at least 25% in overall subjective complaint for improved QOL.    Time 3    Period Weeks    Status On-going    Target Date 01/21/20             PT Long Term Goals - 01/02/20 1618      PT LONG TERM GOAL #1   Title Patient will report an improvement of at least 50% in overall subjective complaint for improved QOL.    Time 6    Period Weeks    Status On-going      PT LONG TERM GOAL #2   Title Patient will demonstrate LT knee AROM flexion of at least 120 degrees to improve mechanics with functional mobility and decreased stress on her hips.    Time 6    Period Weeks    Status On-going      PT LONG TERM GOAL #3   Title Patient will demonstrate ability to perform SLS for at least 10 seconds  indicating improved stability and ease of naviagating stairs.    Time 6    Period Weeks    Status On-going                 Plan - 01/31/20 1014    Clinical Impression Statement Continued session focus iwht gluteal strengthening.  Added side lunges with some cueing for form.  Pt able to complete all exercises with no reports of additional pain.  Assessed knee mobility with AROM 112 flexion.  Manual STM complete to Lt glut med to improve mobility for pain control to assist iwth laying on side, reports of relief following manual.    Personal Factors and Comorbidities Age;Comorbidity 3+    Comorbidities HTN, hypothyroidism, LT TKA    Examination-Activity Limitations Bed Mobility    Examination-Participation Restrictions Other   Sleep   Stability/Clinical Decision Making Stable/Uncomplicated    Clinical Decision Making Low    Rehab Potential Good    PT Frequency 2x / week    PT Duration 6 weeks    PT Treatment/Interventions ADLs/Self Care Home Management;Aquatic Therapy;Cryotherapy;Electrical Stimulation;Moist Heat;Traction;DME Instruction;Gait training;Stair training;Functional mobility training;Therapeutic activities;Therapeutic exercise;Balance training;Neuromuscular re-education;Patient/family education;Orthotic Fit/Training;Manual techniques;Scar mobilization;Passive range of motion;Dry needling;Energy conservation;Taping    PT Next Visit Plan 10th visit progress.  Add knee drives and bike for knee mobility.  Continue hip abductor and extension strengthening.  Progress balance wiht tandem stance static or dynamic surface.    PT Home Exercise Plan 01/02/20: Gluteal stretch, hip adduction isometric and abduction           Patient will benefit from skilled therapeutic intervention in order to improve the following deficits and impairments:  Abnormal gait, Pain, Improper body mechanics, Decreased mobility, Decreased activity tolerance, Decreased strength, Decreased balance, Impaired  flexibility  Visit Diagnosis: Pain in right hip  Muscle weakness (generalized)  Pain in left hip     Problem List Patient Active Problem List   Diagnosis Date Noted  . Overactive bladder 07/25/2018  . Menopausal syndrome 07/25/2018  . Menorrhagia 07/25/2018  . Pain in right knee 11/10/2017  . Chronic anemia 01/05/2017  . Chronic toe pain, right foot 01/05/2017  . CRP elevated 05/12/2016  . Elevated erythrocyte sedimentation rate 05/12/2016  . History of normocytic normochromic anemia 05/12/2016  . Atypical chest pain 04/22/2016  . Urge incontinence 12/02/2015  .  Vitamin D deficiency 08/25/2015  . Acquired hypothyroidism 08/19/2015  . Essential hypertension 08/19/2015  . Hot flushes, perimenopausal 08/19/2015  . Type 2 diabetes mellitus without complication, without long-term current use of insulin (HCC) 08/19/2015  . 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 Presence of right artificial knee joint 07/13/2015  . Primary osteoarthritis of left knee 06/17/2014  . Obesity 06/17/2014  . Diabetes (HCC) 06/17/2014  . Primary osteoarthritis of knee 06/17/2014   Becky Sax, LPTA/CLT; CBIS [phone]  Juel Burrow 02/01/2020, 1:28 PM  [facility] [address] [place], [place], [address] Phone: [phone]   Fax:  [phone]  Name: Meghan Morales MRN: [id number] Date of Birth: [birth year]

## 2020-02-04 ENCOUNTER — Ambulatory Visit (HOSPITAL_COMMUNITY): Payer: 59

## 2020-02-04 ENCOUNTER — Other Ambulatory Visit: Payer: Self-pay

## 2020-02-04 ENCOUNTER — Encounter (HOSPITAL_COMMUNITY): Payer: Self-pay

## 2020-02-04 DIAGNOSIS — M25551 Pain in right hip: Secondary | ICD-10-CM

## 2020-02-04 DIAGNOSIS — M6281 Muscle weakness (generalized): Secondary | ICD-10-CM

## 2020-02-04 DIAGNOSIS — M25552 Pain in left hip: Secondary | ICD-10-CM

## 2020-02-04 NOTE — Patient Instructions (Signed)
Knee drive: Standing with one leg on 2nd step lunge forward until you feel a stretch on front of knee.   Hold for 10-20 seconds and repeat 5-10 times a day.  Knee / [place] Koller on stomach, knees together. Grab one ankle with same side hand. Use towel if needed to reach. Gently pull foot toward buttock.  Hold 30 seconds. Repeat with other leg. Repeat 3 times. Do 1 sessions per day.  Copyright  VHI. All rights reserved.    Single Leg Balance: Eyes Open    Stand on right leg with eyes open. Hold 30 seconds. 3 reps at least once per day.  http://ggbe.exer.us/5   Copyright  VHI. All rights reserved.   Bridge    Lie back, legs bent. Inhale, pressing hips up. Keeping ribs in, lengthen lower back. Exhale, rolling down along spine from top. Repeat 10 times. Do 2 sessions per day.  http://pm.exer.us/55   Copyright  VHI. All rights reserved.   FUNCTIONAL MOBILITY: Squat    Stance: shoulder-width on floor. Bend hips and knees. Keep back straight. Do not allow knees to bend past toes. Squeeze glutes and quads to stand. 10-15 reps per set, 1-2 sets per day, 4 days per week  Copyright  VHI. All rights reserved.

## 2020-02-04 NOTE — Therapy (Addendum)
[facility] [address] [place], [place], [address] Phone: [phone]   Fax:  [phone]  Physical Therapy Treatment / Discharge Summary Progress Note  As a licensed physical therapist I have read and agree with the following note.  Clarene Critchley PT, DPT 8:48 AM, 02/06/20 [phone]   Reporting Period 12/31/19 to 02/04/20  See note below for Objective Data and Assessment of Progress/Goals.   PHYSICAL THERAPY DISCHARGE SUMMARY  Visits from Start of Care: 10  Current functional level related to goals / functional outcomes: See below   Remaining deficits: See below   Education / Equipment: HEP Plan: Patient agrees to discharge.  Patient goals were partially met. Patient is being discharged due to the patient's request.  ?????        Patient Details  Name: Meghan Morales MRN: [id number] Date of Birth: 06/16/1960 Referring Provider (PT): Rosalia Hammers, DO   Encounter Date: 02/04/2020   PT End of Session - 02/04/20 1712    Visit Number 10    Number of Visits 12    Date for PT Re-Evaluation 02/11/20    Authorization Type [place] - Visit Number 10    Authorization - Number of Visits 60    Progress Note Due on Visit 20    PT Start Time 7517   4' on bike, not included with charges   PT Stop Time 1748    PT Time Calculation (min) 50 min    Activity Tolerance Patient tolerated treatment well    Behavior During Therapy [facility] for tasks assessed/performed           Past Medical History:  Diagnosis Date  . Arthritis    knees & back arithrits   . Diabetes mellitus without complication ([place])   . Hypertension   . Hypothyroidism   . Urgency of urination     Past Surgical History:  Procedure Laterality Date  . CARDIAC CATHETERIZATION    . CHOLECYSTECTOMY    . KNEE SURGERY     arthroscopy  . TOTAL KNEE ARTHROPLASTY Left 06/17/2014   DR DALLDORF  . TOTAL KNEE ARTHROPLASTY Left  06/17/2014   Procedure: LEFT TOTAL KNEE ARTHROPLASTY;  Surgeon: Hessie Dibble, MD;  Location: [facility];  Service: Orthopedics;  Laterality: Left;  . TUBAL LIGATION      There were no vitals filed for this visit.       [facility] PT Assessment - 02/04/20 0001      Assessment   Medical Diagnosis Bilateral hip pain; Trochanteric bursitis    Referring Provider (PT) Rosalia Hammers, DO    Onset Date/Surgical Date --   6 months to a year ago   Next MD Visit None aware of    Prior Therapy Yes following TKA      Precautions   Precautions None      Observation/Other Assessments   Focus on Therapeutic Outcomes (FOTO)  71.3%   was 69%     ROM / Strength   AROM / PROM / Strength AROM;Strength      AROM   AROM Assessment Site Lumbar;Knee    Right/Left Knee Left    Left Knee Extension 0    Left Knee Flexion 114   was 112     Strength   Strength Assessment Site Hip;Knee;Ankle    Right/Left Hip Right;Left    Right Hip Flexion 4+/5   was 4+   Right Hip Extension 3/5   was 3/5  Right Hip ABduction 4-/5   was 3-   Left Hip Flexion 4+/5   was 4+   Left Hip Extension 3/5   was 3/5   Left Hip ABduction 4/5   was 3-/5     Transfers   Five time sit to stand comments  14.31" 5 STS no HHA      Ambulation/Gait   Ambulation Distance (Feet) 600 Feet   was 470   Assistive device None    Gait Pattern Decreased trunk rotation    Ambulation Surface Level;Indoor    Gait Comments 2MWT      Balance   Balance Assessed Yes      Static Standing Balance   Static Standing - Balance Support No upper extremity supported    Static Standing Balance -  Activities  Single Leg Stance - Right Leg;Single Leg Stance - Left Leg    Static Standing - Comment/# of Minutes Rt 12", Lt 23" max of 3   was Rt 4", Lt 6"                        [facility] Adult PT Treatment/Exercise - 02/04/20 0001      Ambulation/Gait   Ambulation/Gait Yes      Exercises   Exercises Knee/Hip      Knee/Hip Exercises:  Stretches   Knee: Self-Stretch to increase Flexion 5 reps;10 seconds    Knee: Self-Stretch Limitations knee drives on 30QM step      Knee/Hip Exercises: Aerobic   Stationary Bike 4' on bike full revolution seat 17 (not included with charges, asked FOTO questions during)      Knee/Hip Exercises: Standing   Functional Squat 15 reps    SLS Lt 12", Rt 23"    SLS with Vectors 5x5" BLE with HHA                    PT Short Term Goals - 02/04/20 1705      PT SHORT TERM GOAL #1   Title Patient will report understanding and regular compliance with HEP to improve strength, functional mobility, and decrease pain.    Baseline 02/04/20:  Reports compliance with HEP at least 2 days a week plus rehab days      PT SHORT TERM GOAL #2   Title Patient will report an improvement of at least 25% in overall subjective complaint for improved QOL.    Baseline 02/04/20:  Reoprts improvements by 80%.  Reports main difficulty continues while laying on her side, reports radicular symptoms past her calf only while laying on her side.    Status Achieved             PT Long Term Goals - 02/04/20 1711      PT LONG TERM GOAL #1   Title Patient will report an improvement of at least 50% in overall subjective complaint for improved QOL.    Baseline 02/04/20:  Reoprts improvements by 80%.  Reports main difficulty continues while laying on her side, reports radicular symptoms past her calf only while laying on her side.    Status Achieved      PT LONG TERM GOAL #2   Title Patient will demonstrate LT knee AROM flexion of at least 120 degrees to improve mechanics with functional mobility and decreased stress on her hips.    Baseline 02/04/20: 0-114 degrees (was 112)    Status On-going      PT LONG TERM GOAL #3  Title Patient will demonstrate ability to perform SLS for at least 10 seconds indicating improved stability and ease of naviagating stairs.    Baseline 02/04/2020: Rt 12", Lt 23" max of 3    Status  Achieved                 Plan - 02/04/20 1752    Clinical Impression Statement 10th visit progress note with the following findings:  Pt has acheived 2/2 STGs and 2/3LTGs.  Pt reports compliance with HEP and feels she has improved 80% subjectively.  Pt has improved functionally with increased distance 2MWT, ability to complete 5STS without UE A and improving MMT.  Lt knee AROM 0-114 degrees.  Discussed continuing PT to address goals unmet or DC to advanced HEP, pt stated she is ready to complete these at home.  Pt given advanced HEP for hip strengthening and knee mobility, pt able to demonstrate and verbalized understanding all HEP.    Personal Factors and Comorbidities Age;Comorbidity 3+    Comorbidities HTN, hypothyroidism, LT TKA    Examination-Activity Limitations Bed Mobility    Examination-Participation Restrictions Other   Sleep   Stability/Clinical Decision Making Stable/Uncomplicated    Clinical Decision Making Low    Rehab Potential Good    PT Frequency 2x / week    PT Duration 6 weeks    PT Treatment/Interventions ADLs/Self Care Home Management;Aquatic Therapy;Cryotherapy;Electrical Stimulation;Moist Heat;Traction;DME Instruction;Gait training;Stair training;Functional mobility training;Therapeutic activities;Therapeutic exercise;Balance training;Neuromuscular re-education;Patient/family education;Orthotic Fit/Training;Manual techniques;Scar mobilization;Passive range of motion;Dry needling;Energy conservation;Taping    PT Next Visit Plan DC to HEP.    PT Home Exercise Plan 01/02/20: Gluteal stretch, hip adduction isometric and abduction; 02/04/20: bridge, squat, SLS, sidestep, knee drives and prone quad set.           Patient will benefit from skilled therapeutic intervention in order to improve the following deficits and impairments:  Abnormal gait, Pain, Improper body mechanics, Decreased mobility, Decreased activity tolerance, Decreased strength, Decreased balance, Impaired  flexibility  Visit Diagnosis: Pain in right hip  Muscle weakness (generalized)  Pain in left hip     Problem List Patient Active Problem List   Diagnosis Date Noted  . Overactive bladder 07/25/2018  . Menopausal syndrome 07/25/2018  . Menorrhagia 07/25/2018  . Pain in right knee 11/10/2017  . Chronic anemia 01/05/2017  . Chronic toe pain, right foot 01/05/2017  . CRP elevated 05/12/2016  . Elevated erythrocyte sedimentation rate 05/12/2016  . History of normocytic normochromic anemia 05/12/2016  . Atypical chest pain 04/22/2016  . Urge incontinence 12/02/2015  . Vitamin D deficiency 08/25/2015  . Acquired hypothyroidism 08/19/2015  . Essential hypertension 08/19/2015  . Hot flushes, perimenopausal 08/19/2015  . Type 2 diabetes mellitus without complication, without long-term current use of insulin ([place]) 08/19/2015  . Instability of internal right knee prosthesis ([place]) 07/13/2015  . Presence of right artificial knee joint 07/13/2015  . Primary osteoarthritis of left knee 06/17/2014  . Obesity 06/17/2014  . Diabetes ([place]) 06/17/2014  . Primary osteoarthritis of knee 06/17/2014   Ihor Austin, LPTA/CLT; CBIS [phone]  Aldona Lento 02/04/2020, 7:42 PM  [facility] [address] [place], [place], [address] Phone: [phone]   Fax:  [phone]  Name: Meghan Morales MRN: [id number] Date of Birth: [birth year]

## 2020-02-06 ENCOUNTER — Encounter (HOSPITAL_COMMUNITY): Payer: 59

## 2020-02-11 ENCOUNTER — Encounter (HOSPITAL_COMMUNITY): Payer: 59 | Admitting: Physical Therapy

## 2020-02-13 ENCOUNTER — Encounter (HOSPITAL_COMMUNITY): Payer: 59 | Admitting: Physical Therapy

## 2020-03-02 ENCOUNTER — Other Ambulatory Visit: Payer: Self-pay | Admitting: Internal Medicine

## 2020-03-02 DIAGNOSIS — Z1231 Encounter for screening mammogram for malignant neoplasm of breast: Secondary | ICD-10-CM

## 2020-04-15 ENCOUNTER — Ambulatory Visit
Admission: RE | Admit: 2020-04-15 | Discharge: 2020-04-15 | Disposition: A | Discharge status: Home / Self Care | Payer: 59 | Source: Ambulatory Visit | Attending: Internal Medicine | Admitting: Internal Medicine

## 2020-04-15 ENCOUNTER — Other Ambulatory Visit: Payer: Self-pay

## 2020-04-15 DIAGNOSIS — Z1231 Encounter for screening mammogram for malignant neoplasm of breast: Secondary | ICD-10-CM

## 2020-05-01 ENCOUNTER — Ambulatory Visit: Payer: 59 | Attending: Internal Medicine

## 2020-05-01 DIAGNOSIS — Z23 Encounter for immunization: Secondary | ICD-10-CM

## 2020-05-01 NOTE — Progress Notes (Signed)
   Covid-19 Vaccination Clinic  Name:  Meghan Morales    MRN: [id number] DOB: 03/24/1961  05/01/2020  [name] was observed post Covid-19 immunization for 15 minutes without incident. She was provided with Vaccine Information Sheet and instruction to access the V-Safe system.   [name] was instructed to call 911 with any severe reactions post vaccine: Marland Kitchen Difficulty breathing  . Swelling of face and throat  . A fast heartbeat  . A bad rash all over body  . Dizziness and weakness   Immunizations Administered    Name Date Dose VIS Date Route   Pfizer COVID-19 Vaccine 05/01/2020  1:09 PM 0.3 mL 04/01/2020 Intramuscular   Manufacturer: [employer], [employer]   Lot: [id number]   NDC: [address]-2

## 2020-06-13 HISTORY — PX: OTHER SURGICAL HISTORY: SHX169

## 2020-06-25 ENCOUNTER — Other Ambulatory Visit: Payer: 59

## 2020-06-25 DIAGNOSIS — Z20822 Contact with and (suspected) exposure to covid-19: Secondary | ICD-10-CM

## 2020-06-27 LAB — SARS-COV-2, NAA 2 DAY TAT

## 2020-06-27 LAB — NOVEL CORONAVIRUS, NAA: SARS-CoV-2, NAA: NOT DETECTED

## 2020-10-20 ENCOUNTER — Ambulatory Visit: Payer: 59 | Admitting: Obstetrics and Gynecology

## 2020-10-28 ENCOUNTER — Ambulatory Visit: Payer: 59

## 2020-11-04 ENCOUNTER — Ambulatory Visit: Payer: 59 | Attending: Internal Medicine

## 2020-11-04 DIAGNOSIS — Z23 Encounter for immunization: Secondary | ICD-10-CM

## 2020-11-04 NOTE — Progress Notes (Signed)
   Covid-19 Vaccination Clinic  Name:  Zeta Bucy    MRN: [id number] DOB: October 02, 1960  11/04/2020  Ms. Banta was observed post Covid-19 immunization for 15 minutes without incident. She was provided with Vaccine Information Sheet and instruction to access the V-Safe system.   Ms. Cockrum was instructed to call 911 with any severe reactions post vaccine: Marland Kitchen Difficulty breathing  . Swelling of face and throat  . A fast heartbeat  . A bad rash all over body  . Dizziness and weakness   Immunizations Administered    Name Date Dose VIS Date Route   PFIZER Comrnaty(Gray TOP) Covid-19 Vaccine 11/04/2020 11:11 AM 0.3 mL 05/21/2020 Intramuscular   Manufacturer: [employer], [employer]   Lot: [id number]   NDC: [phone]

## 2020-11-05 ENCOUNTER — Other Ambulatory Visit (HOSPITAL_COMMUNITY): Payer: Self-pay

## 2020-11-05 MED ORDER — COVID-19 MRNA VAC-TRIS(PFIZER) 30 MCG/0.3ML IM SUSP
INTRAMUSCULAR | 0 refills | Status: DC
  Filled 2020-11-05: qty 0.3, 17d supply, fill #0

## 2020-11-06 ENCOUNTER — Other Ambulatory Visit (HOSPITAL_COMMUNITY): Payer: Self-pay

## 2020-12-24 ENCOUNTER — Telehealth: Payer: Self-pay

## 2020-12-24 NOTE — Telephone Encounter (Signed)
Attempt made to contact Meghan Morales is a 60 y.o. female re: New Patient appointment with Dr. Florian Buff. Pt was not available.  LM on the VM for the patient to call me back.

## 2020-12-29 ENCOUNTER — Ambulatory Visit (INDEPENDENT_AMBULATORY_CARE_PROVIDER_SITE_OTHER): Payer: 59 | Admitting: Obstetrics and Gynecology

## 2020-12-29 ENCOUNTER — Other Ambulatory Visit: Payer: Self-pay

## 2020-12-29 ENCOUNTER — Encounter: Payer: Self-pay | Admitting: Obstetrics and Gynecology

## 2020-12-29 VITALS — BP 137/87 | HR 84 | Ht 71.0 in

## 2020-12-29 DIAGNOSIS — R159 Full incontinence of feces: Secondary | ICD-10-CM | POA: Diagnosis not present

## 2020-12-29 DIAGNOSIS — R350 Frequency of micturition: Secondary | ICD-10-CM | POA: Diagnosis not present

## 2020-12-29 DIAGNOSIS — N3281 Overactive bladder: Secondary | ICD-10-CM

## 2020-12-29 LAB — POCT URINALYSIS DIPSTICK
Appearance: NORMAL
Bilirubin, UA: NEGATIVE
Blood, UA: NEGATIVE
Glucose, UA: NEGATIVE
Ketones, UA: NEGATIVE
Nitrite, UA: NEGATIVE
Protein, UA: NEGATIVE
Spec Grav, UA: 1.01 (ref 1.010–1.025)
Urobilinogen, UA: 0.2 E.U./dL
pH, UA: 5 (ref 5.0–8.0)

## 2020-12-29 NOTE — Progress Notes (Signed)
[name]  Referring Provider: [name], MD PCP: [name], MD Date of Service: 12/29/2020  SUBJECTIVE Chief Complaint: New Patient (Initial Visit)  History of Present Illness: Meghan Morales is a 60 y.o. Black or African-American female seen in consultation at the request of Dr. Henderson Cloud for evaluation of urge incontinence.    Review of records significant for: Has urgency and leakage on the way to the bathroom. Tried Detrol but it caused palpitations.   Urinary Symptoms: Leaks urine with with a full bladder, with movement to the bathroom, and with urgency. Hard to stop urinating once she starts leaking on the way to the bathroom. Leaks "all the time" Pad use: 2 pads per day She is bothered by her UI symptoms. Tried tolterodine, enablex, ditropan, myrbetriq and detrol. Tolterodine helped some but became expensive.   Day time voids every few hours.  Nocturia: 5-6 times per night to void. Voiding dysfunction: she empties her bladder well.  does not use a catheter to empty bladder.  When urinating, she feels dribbling after finishing, the need to urinate multiple times in a row, and to push on her belly or vagina to empty bladder Drinks: 2 cups coffee every other day, 4- 16oz bottles water per day, limits more when she is at work  UTIs: 1 UTI's in the last year.   Denies history of blood in urine and kidney or bladder stones  Pelvic Organ Prolapse Symptoms:                  She Denies a feeling of a bulge the vaginal area.   Bowel Symptom: Bowel movements: 1-2 time(s) per day Stool consistency: loose Straining: no.  Splinting: no.  Incomplete evacuation: no.  She Admits to accidental bowel leakage / fecal incontinence  - occurs with soft BM Bowel regimen: none Last colonoscopy: Date 08/2020 Results negative  Sexual Function Sexually active: yes.  Sexual orientation:  heterosexual Pain with  sex: No  Pelvic Pain Denies pelvic pain   Past Medical History:  Past Medical History:  Diagnosis Date   Arthritis    knees & back arithrits    Diabetes mellitus without complication (HCC)    Hypertension    Hypothyroidism    Urgency of urination      Past Surgical History:   Past Surgical History:  Procedure Laterality Date   CARDIAC CATHETERIZATION     CHOLECYSTECTOMY     DG GALL BLADDER     KNEE SURGERY     arthroscopy   TOTAL KNEE ARTHROPLASTY Left 06/17/2014   DR DALLDORF   TOTAL KNEE ARTHROPLASTY Left 06/17/2014   Procedure: LEFT TOTAL KNEE ARTHROPLASTY;  Surgeon: Velna Ochs, MD;  Location: [facility] OR;  Service: Orthopedics;  Laterality: Left;   TUBAL LIGATION       Past OB/GYN History: G3 P2 Vaginal deliveries: 2,  Forceps/ Vacuum deliveries: 0, Cesarean section: 0 Menopausal: Yes, Denies vaginal bleeding since menopause  Any history of abnormal pap smears: no.   Medications: She has a current medication list which includes the following prescription(s): aspirin, diltiazem, levothyroxine, metformin, and telmisartan-hydrochlorothiazide.   Allergies: Patient is allergic to oxybutynin.   Social History:  Social History   Tobacco Use   Smoking status: Never   Smokeless tobacco: Never  Vaping Use   Vaping Use: Never used  Substance Use Topics   Alcohol use: No   Drug use: No    Relationship status: married She lives with  husband.   She is employed- retired, but works occasionally still.   Family History:   Family History  Problem Relation Age of Onset   Hypertension Mother    Throat cancer Father      Review of Systems: Review of Systems  Constitutional:  Negative for fever, malaise/fatigue and weight loss.  Respiratory:  Negative for cough, shortness of breath and wheezing.   Cardiovascular:  Negative for chest pain, palpitations and leg swelling.  Gastrointestinal:  Negative for abdominal pain and blood in stool.  Genitourinary:   Negative for dysuria.  Musculoskeletal:  Negative for myalgias.  Skin:  Negative for rash.  Neurological:  Negative for dizziness and headaches.  Endo/Heme/Allergies:  Does not bruise/bleed easily.  Psychiatric/Behavioral:  Negative for depression. The patient is not nervous/anxious.     OBJECTIVE Physical Exam: Vitals:   12/29/20 1536  BP: 137/87  Pulse: 84  Height: 5\' 11"  (1.803 m)    Physical Exam Constitutional:      General: She is not in acute distress. Pulmonary:     Effort: Pulmonary effort is normal.  Abdominal:     General: There is no distension.     Palpations: Abdomen is soft.     Tenderness: There is no abdominal tenderness. There is no rebound.  Musculoskeletal:        General: No swelling. Normal range of motion.  Skin:    General: Skin is warm and dry.     Findings: No rash.  Neurological:     Mental Status: She is alert and oriented to person, place, and time.  Psychiatric:        Mood and Affect: Mood normal.        Behavior: Behavior normal.     GU / Detailed Urogynecologic Evaluation:  Pelvic Exam: Normal external female genitalia; Bartholin's and Skene's glands normal in appearance; urethral meatus normal in appearance, no urethral masses or discharge.   CST: negative   Speculum exam reveals normal vaginal mucosa without atrophy. Cervix normal appearance. Uterus normal single, nontender. Adnexa no mass, fullness, tenderness.    Pelvic floor strength IV/V  Pelvic floor musculature: Right levator non-tender, Right obturator non-tender, Left levator non-tender, Left obturator non-tender  POP-Q:   POP-Q  -2                                            Aa   -2                                           Ba  -6.5                                              C   2                                            Gh  2  Pb  8.5                                            tvl   -3                                             Ap  -3                                            Bp  -8                                              D     Rectal Exam:  Normal external rectum  Post-Void Residual (PVR) by Bladder Scan: In order to evaluate bladder emptying, we discussed obtaining a postvoid residual and she agreed to this procedure.  Procedure: The ultrasound unit was placed on the patient's abdomen in the suprapubic region after the patient had voided. A PVR of 0 ml was obtained by bladder scan.  Laboratory Results: POC urine: trace leukocytes  I visualized the urine specimen, noting the specimen to be clear yellow  ASSESSMENT AND PLAN Ms. Park is a 60 y.o. with:  1. Overactive bladder   2. Urinary frequency   3. Full incontinence of feces    OAB We discussed the symptoms of overactive bladder (OAB), which include urinary urgency, urinary frequency, nocturia, with or without urge incontinence.  While we do not know the exact etiology of OAB, several treatment options exist. We discussed management including behavioral therapy (decreasing bladder irritants, urge suppression strategies, timed voids, bladder retraining), physical therapy, medication.  For refractory OAB we reviewed the procedure for intravesical Botox injection with cystoscopy in the office and reviewed the risks, benefits and alternatives of treatment including but not limited to infection, need for self-catheterization and need for repeat therapy.  We discussed that there is a 5-15% chance of needing to catheterize with Botox and that this usually resolves in a few months; however can persist for longer periods of time.  Typically Botox injections would need to be repeated every 3-12 months since this is not a permanent therapy.   We discussed the role of sacral neuromodulation and how it works. It requires a test phase, and documentation of bladder function via diary. After a successful test period, a permanent wire and  generator are placed in the OR. The battery lasts 5 years on average and would need to be replaced surgically.  The goal of this therapy is at least a 50% improvement in symptoms. It is NOT realistic to expect a 100% cure.  We reviewed the fact that about 30% of patients fail the test phase and are not candidates for permanent generator placement.    We also discussed the role of percutaneous tibial nerve stimulation and how it works.  She understands it requires 12 weekly visits for temporary neuromodulation of the sacral nerve roots via the tibial nerve and that she may then require continued tapered treatment.    She  is unsure about which she would like to proceed with. Possibly interested in SNM or PTNS. Discussed the added benefit of treatment of fecal incontinence with SNM. Also reviewed we can try Gemtasa but would likely require prior authorization.   2. Fecal incontinence - Treatment options include anti-diarrhea medication (loperamide/ Imodium OTC or prescription lomotil), fiber supplements, physical therapy, and possible sacral neuromodulation or surgery.   - We discussed adding metamucil for stool bulking. She is concerned that this will interact with her thyroid medication (synthroid). Reviewed interaction risks and there is a risk with glycerol but not with fiber. Requested she speak to the pharmacist to ensure no additional interactions.    She will contact our office with what treatment she decides.    Marguerita BeardsMichelle N Brack Shaddock, MD   Medical Decision Making:  - Reviewed/ ordered a clinical laboratory test - Review and summation of prior records

## 2020-12-29 NOTE — Patient Instructions (Signed)
Accidental Bowel Leakage: Our goal is to achieve formed bowel movements daily or every-other-day without leakage.  You may need to try different combinations of the following options to find what works best for you.  Some management options include: Dietary changes (more leafy greens, vegetables and fruits; less processed foods) Fiber supplementation (Metamucil or something with psyllium as active ingredient) Over-the-counter imodium (tablets or liquid) to help solidify the stool and prevent leakage of stool.  

## 2020-12-30 ENCOUNTER — Encounter: Payer: Self-pay | Admitting: Obstetrics and Gynecology

## 2021-03-29 ENCOUNTER — Other Ambulatory Visit: Payer: Self-pay | Admitting: Internal Medicine

## 2021-03-29 DIAGNOSIS — Z1231 Encounter for screening mammogram for malignant neoplasm of breast: Secondary | ICD-10-CM

## 2021-04-30 ENCOUNTER — Ambulatory Visit
Admission: RE | Admit: 2021-04-30 | Discharge: 2021-04-30 | Disposition: A | Discharge status: Home / Self Care | Payer: 59 | Source: Ambulatory Visit | Attending: Internal Medicine | Admitting: Internal Medicine

## 2021-04-30 ENCOUNTER — Other Ambulatory Visit: Payer: Self-pay

## 2021-04-30 DIAGNOSIS — Z1231 Encounter for screening mammogram for malignant neoplasm of breast: Secondary | ICD-10-CM

## 2021-05-13 ENCOUNTER — Other Ambulatory Visit (HOSPITAL_BASED_OUTPATIENT_CLINIC_OR_DEPARTMENT_OTHER): Payer: Self-pay

## 2021-05-13 ENCOUNTER — Ambulatory Visit: Payer: 59 | Attending: Internal Medicine

## 2021-05-13 DIAGNOSIS — Z23 Encounter for immunization: Secondary | ICD-10-CM

## 2021-05-13 MED ORDER — PFIZER COVID-19 VAC BIVALENT 30 MCG/0.3ML IM SUSP
INTRAMUSCULAR | 0 refills | Status: AC
  Filled 2021-05-13: qty 0.3, 1d supply, fill #0

## 2021-05-13 NOTE — Progress Notes (Signed)
   Covid-19 Vaccination Clinic  Name:  Meghan Morales    MRN: [id number] DOB: [birth year]  05/13/2021  [name] was observed post Covid-19 immunization for 15 minutes without incident. She was provided with Vaccine Information Sheet and instruction to access the V-Safe system.   [name] was instructed to call 911 with any severe reactions post vaccine: Difficulty breathing  Swelling of face and throat  A fast heartbeat  A bad rash all over body  Dizziness and weakness   Immunizations Administered     Name Date Dose VIS Date Route   Pfizer Covid-19 Vaccine Bivalent Booster 05/13/2021  2:04 PM 0.3 mL 02/10/2021 Intramuscular   Manufacturer: [employer], [employer]   Lot: [id number]   NDC: [id number]

## 2022-02-11 ENCOUNTER — Ambulatory Visit (INDEPENDENT_AMBULATORY_CARE_PROVIDER_SITE_OTHER): Payer: 59 | Admitting: Podiatry

## 2022-02-11 ENCOUNTER — Encounter: Payer: Self-pay | Admitting: Podiatry

## 2022-02-11 DIAGNOSIS — M21619 Bunion of unspecified foot: Secondary | ICD-10-CM

## 2022-02-11 NOTE — Progress Notes (Signed)
Subjective:   Patient ID: Meghan Morales, female   DOB: 61 y.o.   MRN: [id number]   HPI Patient is a diabetic and is concerned about calluses on both feet and states that she is scared to take care of them herself and at this point to get pedicures until she sees me.  States her A1c was 6.3   ROS      Objective:  Physical Exam  Neuro vascular status intact with keratotic lesions hallux bilateral with moderate structural bunion deformity right over left and good sharp dull and vibratory sensation     Assessment:  Low risk patient with structural deformity keratotic lesion formation     Plan:  H&P reviewed keratotic lesion formation do not recommend current treatment for it but I did debride some lesions courtesy I discussed that I do feel comfortable with her getting pedicures at this time

## 2022-04-01 ENCOUNTER — Other Ambulatory Visit: Payer: Self-pay | Admitting: Internal Medicine

## 2022-04-01 DIAGNOSIS — Z1231 Encounter for screening mammogram for malignant neoplasm of breast: Secondary | ICD-10-CM

## 2022-04-04 ENCOUNTER — Encounter: Payer: Self-pay | Admitting: *Deleted

## 2022-05-24 ENCOUNTER — Ambulatory Visit
Admission: RE | Admit: 2022-05-24 | Discharge: 2022-05-24 | Disposition: A | Discharge status: Home / Self Care | Payer: 59 | Source: Ambulatory Visit | Attending: Internal Medicine | Admitting: Internal Medicine

## 2022-05-24 DIAGNOSIS — Z1231 Encounter for screening mammogram for malignant neoplasm of breast: Secondary | ICD-10-CM

## 2022-06-03 ENCOUNTER — Other Ambulatory Visit (HOSPITAL_COMMUNITY): Payer: Self-pay | Admitting: Internal Medicine

## 2022-06-03 DIAGNOSIS — R221 Localized swelling, mass and lump, neck: Secondary | ICD-10-CM

## 2022-06-08 ENCOUNTER — Ambulatory Visit (HOSPITAL_COMMUNITY)
Admission: RE | Admit: 2022-06-08 | Discharge: 2022-06-08 | Disposition: A | Discharge status: Home / Self Care | Payer: 59 | Source: Ambulatory Visit | Attending: Internal Medicine | Admitting: Internal Medicine

## 2022-06-08 DIAGNOSIS — R221 Localized swelling, mass and lump, neck: Secondary | ICD-10-CM | POA: Insufficient documentation

## 2022-06-08 LAB — POCT I-STAT CREATININE: Creatinine, Ser: 0.9 mg/dL (ref 0.44–1.00)

## 2022-06-08 MED ORDER — IOHEXOL 300 MG/ML  SOLN
75.0000 mL | Freq: Once | INTRAMUSCULAR | Status: AC | PRN
  Administered 2022-06-08: 75 mL via INTRAVENOUS

## 2022-06-16 ENCOUNTER — Other Ambulatory Visit: Payer: Self-pay

## 2022-06-16 MED ORDER — COMIRNATY 30 MCG/0.3ML IM SUSY
0.3000 mL | PREFILLED_SYRINGE | Freq: Once | INTRAMUSCULAR | 0 refills | Status: AC
  Filled 2022-06-16: qty 0.3, 1d supply, fill #0

## 2022-10-01 ENCOUNTER — Other Ambulatory Visit: Payer: Self-pay | Admitting: Internal Medicine

## 2022-10-01 DIAGNOSIS — R590 Localized enlarged lymph nodes: Secondary | ICD-10-CM

## 2022-10-17 ENCOUNTER — Ambulatory Visit
Admission: RE | Admit: 2022-10-17 | Discharge: 2022-10-17 | Disposition: A | Discharge status: Home / Self Care | Payer: 59 | Source: Ambulatory Visit | Attending: Internal Medicine | Admitting: Internal Medicine

## 2022-10-17 DIAGNOSIS — R590 Localized enlarged lymph nodes: Secondary | ICD-10-CM

## 2022-11-16 ENCOUNTER — Other Ambulatory Visit: Payer: Self-pay

## 2022-11-16 ENCOUNTER — Ambulatory Visit
Admission: EM | Admit: 2022-11-16 | Discharge: 2022-11-16 | Disposition: A | Discharge status: Home / Self Care | Payer: 59 | Attending: Emergency Medicine | Admitting: Emergency Medicine

## 2022-11-16 DIAGNOSIS — M79602 Pain in left arm: Secondary | ICD-10-CM

## 2022-11-16 DIAGNOSIS — R079 Chest pain, unspecified: Secondary | ICD-10-CM | POA: Diagnosis not present

## 2022-11-16 MED ORDER — BACLOFEN 5 MG PO TABS: 5.0000 mg | ORAL_TABLET | Freq: Every evening | ORAL | 0 refills | Status: DC | PRN

## 2022-11-16 MED ORDER — MELOXICAM 15 MG PO TABS: 15.0000 mg | ORAL_TABLET | Freq: Every day | ORAL | 0 refills | Status: DC

## 2022-11-16 MED ORDER — KETOROLAC TROMETHAMINE 30 MG/ML IJ SOLN
30.0000 mg | Freq: Once | INTRAMUSCULAR | Status: AC
  Administered 2022-11-16: 30 mg via INTRAMUSCULAR

## 2022-11-16 NOTE — ED Provider Notes (Signed)
[facility]    CSN: [id number] Arrival date & time: 11/16/22  1206      History   Chief Complaint No chief complaint on file.   HPI Meghan Morales is a 62 y.o. female.   Presents for evaluation of left arm and left-sided chest pain present for 2 days.  Pain starting at the been of the left arm radiating to the left side of the chest, pain primarily in the arm.  Pain is described as constant, rated 8 out of 10, throbbing.  Is not exacerbated by movement.  Denies injury or trauma, numbness tingling, injury or trauma.  Denies shortness of breath, dizziness lightheadedness, visual changes.  History of hypertension and diabetes mellitus, takes medication daily as prescribed without missing doses.  Has attempted use of Tylenol and Tylenol arthritis which did help to minimize some of the discomfort but did not resolve pain.    Past Medical History:  Diagnosis Date   Arthritis    knees & back arithrits    Diabetes mellitus without complication (HCC)    Hypertension    Hypothyroidism    Urgency of urination     Patient Active Problem List   Diagnosis Date Noted   Overactive bladder 07/25/2018   Menopausal syndrome 07/25/2018   Menorrhagia 07/25/2018   Pain in right knee 11/10/2017   Chronic anemia 01/05/2017   Chronic toe pain, right foot 01/05/2017   CRP elevated 05/12/2016   Elevated erythrocyte sedimentation rate 05/12/2016   History of normocytic normochromic anemia 05/12/2016   Atypical chest pain 04/22/2016   Urge incontinence 12/02/2015   Vitamin D deficiency 08/25/2015   Acquired hypothyroidism 08/19/2015   Essential hypertension 08/19/2015   Hot flushes, perimenopausal 08/19/2015   Type 2 diabetes mellitus without complication, without long-term current use of insulin (HCC) 08/19/2015   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/13/2015   Presence of right artificial knee joint 07/13/2015   Primary osteoarthritis of left knee  06/17/2014   Obesity 06/17/2014   Diabetes (HCC) 06/17/2014   Primary osteoarthritis of knee 06/17/2014    Past Surgical History:  Procedure Laterality Date   CARDIAC CATHETERIZATION     CHOLECYSTECTOMY     DG GALL BLADDER     KNEE SURGERY     arthroscopy   TOTAL KNEE ARTHROPLASTY Left 06/17/2014   DR DALLDORF   TOTAL KNEE ARTHROPLASTY Left 06/17/2014   Procedure: LEFT TOTAL KNEE ARTHROPLASTY;  Surgeon: Velna Ochs, MD;  Location: [facility] OR;  Service: Orthopedics;  Laterality: Left;   TUBAL LIGATION      OB History   No obstetric history on file.      Home Medications    Prior to Admission medications   Medication Sig Start Date End Date Taking? Authorizing Provider  aspirin 81 MG chewable tablet Chew 81 mg by mouth daily.    [provider]  COVID-19 mRNA bivalent vaccine, Pfizer, (PFIZER COVID-19 VAC BIVALENT) injection Inject into the muscle. 05/13/21   Judyann Munson, MD  diltiazem (DILACOR XR) 120 MG 24 hr capsule Take 120 mg by mouth daily.    [provider]  levothyroxine (SYNTHROID) 75 MCG tablet Take 75 mcg by mouth daily before breakfast.    [provider]  metFORMIN (GLUCOPHAGE) 500 MG tablet Take 500 mg by mouth 2 (two) times daily with a meal.     [provider]  telmisartan-hydrochlorothiazide (MICARDIS HCT) 80-25 MG per tablet Take 1 tablet by mouth daily.    [provider]    Family History Family History  Problem Relation Age of Onset   Hypertension Mother    Throat cancer Father    Breast cancer Neg Hx     Social History Social History   Tobacco Use   Smoking status: Never   Smokeless tobacco: Never  Vaping Use   Vaping Use: Never used  Substance Use Topics   Alcohol use: No   Drug use: No     Allergies   Oxybutynin   Review of Systems Review of Systems   Physical Exam Triage Vital Signs ED Triage Vitals  Enc Vitals Group     BP 11/16/22 1211 (!) 143/83     Pulse Rate 11/16/22  1211 87     Resp 11/16/22 1211 14     Temp --      Temp src --      SpO2 11/16/22 1211 95 %     Weight --      Height --      Head Circumference --      Peak Flow --      Pain Score 11/16/22 1214 8     Pain Loc --      Pain Edu? --      Excl. in GC? --    No data found.  Updated Vital Signs BP (!) 143/83   Pulse 87   Resp 14   SpO2 95%   Visual Acuity Right Eye Distance:   Left Eye Distance:   Bilateral Distance:    Right Eye Near:   Left Eye Near:    Bilateral Near:     Physical Exam Constitutional:      Appearance: Normal appearance.  Eyes:     Extraocular Movements: Extraocular movements intact.  Cardiovascular:     Rate and Rhythm: Normal rate and regular rhythm.     Pulses: Normal pulses.     Heart sounds: Normal heart sounds.  Pulmonary:     Effort: Pulmonary effort is normal.     Breath sounds: Normal breath sounds.  Chest:     Comments: No tenderness to the chest wall, chest wall symmetrical Musculoskeletal:     Comments: No tenderness on exam, no ecchymosis, swelling complete range of motion of the upper extremity, 2+ brachial pulse, strength is a 5 out of 5  Neurological:     Mental Status: She is alert and oriented to person, place, and time. Mental status is at baseline.      UC Treatments / Results  Labs (all labs ordered are listed, but only abnormal results are displayed) Labs Reviewed - No data to display  EKG   Radiology No results found.  Procedures Procedures (including critical care time)  Medications Ordered in UC Medications - No data to display  Initial Impression / Assessment and Plan / UC Course  I have reviewed the triage vital signs and the nursing notes.  Pertinent labs & imaging results that were available during my care of the patient were reviewed by me and considered in my medical decision making (see chart for details).  Left arm pain, left-sided chest pain  Vital signs are stable patient while visibly in  pain is in no signs distress nontoxic-appearing, EKG shows normal sinus rhythm with PVCs, S1 and S2 heard to auscultation, as pain was less than with use of analgesics we will move forward with muscular treatment, Toradol injection given in office and prescribed meloxicam for home use, recommended additional supportive care to  use of right heat massage stretching and activity as tolerated, given strict precautions for symptoms worsening in severity specifically in the chest that she is to go to the nearest emergency department for immediate evaluation and for any symptoms that continue to persist she is to follow-up with her primary doctor for reevaluation Final Clinical Impressions(s) / UC Diagnoses   Final diagnoses:  None   Discharge Instructions   None    ED Prescriptions   None    PDMP not reviewed this encounter.   Valinda Hoar, NP 11/16/22 1444

## 2022-11-16 NOTE — Discharge Instructions (Addendum)
EKG shows the heart is beating regular pace and rhythm with an occasional extra beat, this is not an emergent rhythm  Will move forward today with management of muscular pain since symptoms did seem to lessen after use of Tylenol arthritis  You have been given an injection of Toradol here today in the office, this medicine helps to reduce inflammation which in turn should help with your pain, daily you will start to see some form of relief in about 30 minutes to an hour  Starting tomorrow take meloxicam every morning for 5 days to continue the above process then you may use medication as needed, may take Tylenol throughout the day as needed in addition to this  You may use baclofen at bedtime as needed for additional comfort  You may use ice or heat over the affected area in 10 to 15-minute intervals  You may elevate the arm on 2 pillows for comfort and support  If your symptoms continue to persist past use of medication please follow-up with your primary doctor   At any point if your symptoms significantly worsen in severity please go to the nearest emergency department for further evaluation and management

## 2022-11-16 NOTE — ED Triage Notes (Signed)
Pt c/o left arm pain radiating up to neck and chest area x 2 days. Pt states the pain started at the inner elbow and goes up towards neck and chest. Pt denies any injury.

## 2022-11-18 ENCOUNTER — Emergency Department (HOSPITAL_COMMUNITY): Payer: 59

## 2022-11-18 ENCOUNTER — Emergency Department (HOSPITAL_COMMUNITY)
Admission: EM | Admit: 2022-11-18 | Discharge: 2022-11-18 | Disposition: A | Discharge status: Home / Self Care | Payer: 59 | Attending: Emergency Medicine | Admitting: Emergency Medicine

## 2022-11-18 ENCOUNTER — Other Ambulatory Visit: Payer: Self-pay

## 2022-11-18 DIAGNOSIS — E119 Type 2 diabetes mellitus without complications: Secondary | ICD-10-CM | POA: Insufficient documentation

## 2022-11-18 DIAGNOSIS — I10 Essential (primary) hypertension: Secondary | ICD-10-CM | POA: Diagnosis not present

## 2022-11-18 DIAGNOSIS — Z96652 Presence of left artificial knee joint: Secondary | ICD-10-CM | POA: Insufficient documentation

## 2022-11-18 DIAGNOSIS — Z79899 Other long term (current) drug therapy: Secondary | ICD-10-CM | POA: Insufficient documentation

## 2022-11-18 DIAGNOSIS — R0789 Other chest pain: Secondary | ICD-10-CM | POA: Diagnosis present

## 2022-11-18 DIAGNOSIS — E039 Hypothyroidism, unspecified: Secondary | ICD-10-CM | POA: Diagnosis not present

## 2022-11-18 DIAGNOSIS — Z7982 Long term (current) use of aspirin: Secondary | ICD-10-CM | POA: Diagnosis not present

## 2022-11-18 DIAGNOSIS — Z7984 Long term (current) use of oral hypoglycemic drugs: Secondary | ICD-10-CM | POA: Insufficient documentation

## 2022-11-18 DIAGNOSIS — R079 Chest pain, unspecified: Secondary | ICD-10-CM

## 2022-11-18 DIAGNOSIS — M79602 Pain in left arm: Secondary | ICD-10-CM | POA: Diagnosis not present

## 2022-11-18 LAB — BASIC METABOLIC PANEL
Anion gap: 10 (ref 5–15)
BUN: 13 mg/dL (ref 8–23)
CO2: 27 mmol/L (ref 22–32)
Calcium: 9.3 mg/dL (ref 8.9–10.3)
Chloride: 102 mmol/L (ref 98–111)
Creatinine, Ser: 0.81 mg/dL (ref 0.44–1.00)
GFR, Estimated: >60 mL/min (ref >60)
Glucose, Bld: 170 mg/dL — ABNORMAL HIGH (ref 70–99)
Potassium: 3.5 mmol/L (ref 3.5–5.1)
Sodium: 139 mmol/L (ref 135–145)

## 2022-11-18 LAB — CBC
HCT: 39.9 % (ref 36.0–46.0)
Hemoglobin: 12.7 g/dL (ref 12.0–15.0)
MCH: 25.7 pg — ABNORMAL LOW (ref 26.0–34.0)
MCHC: 31.8 g/dL (ref 30.0–36.0)
MCV: 80.8 fL (ref 80.0–100.0)
Platelets: 378 10*3/uL (ref 150–400)
RBC: 4.94 MIL/uL (ref 3.87–5.11)
RDW: 14.6 % (ref 11.5–15.5)
WBC: 6.1 10*3/uL (ref 4.0–10.5)
nRBC: 0 % (ref 0.0–0.2)

## 2022-11-18 LAB — TROPONIN I (HIGH SENSITIVITY)
Troponin I (High Sensitivity): 5 ng/L (ref <18)
Troponin I (High Sensitivity): 7 ng/L (ref <18)

## 2022-11-18 LAB — D-DIMER, QUANTITATIVE: D-Dimer, Quant: 2.01 ug/mL-FEU — ABNORMAL HIGH (ref 0.00–0.50)

## 2022-11-18 MED ORDER — IOHEXOL 350 MG/ML SOLN
75.0000 mL | Freq: Once | INTRAVENOUS | Status: AC | PRN
  Administered 2022-11-18: 75 mL via INTRAVENOUS

## 2022-11-18 MED ORDER — OXYCODONE-ACETAMINOPHEN 5-325 MG PO TABS
1.0000 | ORAL_TABLET | Freq: Once | ORAL | Status: AC
  Administered 2022-11-18: 1 via ORAL
  Filled 2022-11-18: qty 1

## 2022-11-18 NOTE — Discharge Instructions (Addendum)
Your labs were significant for elevated D-dimer today.  Take tylenol/ibuprofen for pain. I recommend close follow-up with PCP for reevaluation, make sure you do not have blood clots in your arm.  Please do not hesitate to return to emergency department if worrisome signs symptoms we discussed become apparent.

## 2022-11-18 NOTE — ED Triage Notes (Signed)
Patient presents for constant L sided chest pain with radiation to L arm since Wednesday. Denies other associated symptoms. Went to UC and they gave her Meloxicam and Baclofen, which she has been taking without relief, though she states Tylenol helps the arm pain. Went to [facility] this morning d/t the arm pain and was advised to come to the ED.

## 2022-11-18 NOTE — Progress Notes (Signed)
While VAST RN at bedside assessing vasculature with ultrasound patient complained of increased chest discomfort. Sat patient up to an 80 degree angle; after a few minutes patient reported discomfort as being "much better; I barely feel it right now".  Patient also verbalized that she has been urinating much more today than usual. She has not had anything to eat since this am when she had a bowl of rice krispies with milk and a banana. Since then she has had a sip of water to take a pill.

## 2022-11-19 NOTE — ED Notes (Signed)
Pt updated that she is able to eat at this time.  This RN offered to find a snack for pt in department once finished taking care of another critical pt in this RN's assignment.

## 2022-11-19 NOTE — ED Provider Notes (Signed)
[place] EMERGENCY DEPARTMENT AT [facility] Provider Note   CSN: [id number] Arrival date & time: 11/18/22  1125     History  Chief Complaint  Patient presents with   Chest Pain    Meghan Morales is a 62 y.o. female with a past medical history of diabetes, hypertension presents for evaluation of chest pain and left arm pain.  Patient states she started to have pain in her left arm on Wednesday.  States that the pain then radiates to her chest.  Pain is constant, nonexertional, denies any aggravating or alleviating factors.  She was evaluated at urgent care, was given meloxicam and baclofen, which she has been taking without relief.  Denies fever, shortness of breath, nausea, vomiting, diaphoresis, cough.  No personal or family history of DVT/PE.  No recent travel or surgery.  Denies any cough.   Chest Pain     Past Medical History:  Diagnosis Date   Arthritis    knees & back arithrits    Diabetes mellitus without complication (HCC)    Hypertension    Hypothyroidism    Urgency of urination    Past Surgical History:  Procedure Laterality Date   CARDIAC CATHETERIZATION     CHOLECYSTECTOMY     DG GALL BLADDER     KNEE SURGERY     arthroscopy   TOTAL KNEE ARTHROPLASTY Left 06/17/2014   DR DALLDORF   TOTAL KNEE ARTHROPLASTY Left 06/17/2014   Procedure: LEFT TOTAL KNEE ARTHROPLASTY;  Surgeon: Velna Ochs, MD;  Location: [facility] OR;  Service: Orthopedics;  Laterality: Left;   TUBAL LIGATION       Home Medications Prior to Admission medications   Medication Sig Start Date End Date Taking? Authorizing Provider  aspirin 81 MG chewable tablet Chew 81 mg by mouth daily.    [provider]  Baclofen 5 MG TABS Take 1 tablet (5 mg total) by mouth at bedtime as needed. 11/16/22   White, Elita Boone, NP  COVID-19 mRNA bivalent vaccine, Pfizer, (PFIZER COVID-19 VAC BIVALENT) injection Inject into the muscle. 05/13/21   Judyann Munson, MD  diltiazem  (DILACOR XR) 120 MG 24 hr capsule Take 120 mg by mouth daily.    [provider]  levothyroxine (SYNTHROID) 75 MCG tablet Take 75 mcg by mouth daily before breakfast.    [provider]  meloxicam (MOBIC) 15 MG tablet Take 1 tablet (15 mg total) by mouth daily. 11/16/22   White, Elita Boone, NP  metFORMIN (GLUCOPHAGE) 500 MG tablet Take 500 mg by mouth 2 (two) times daily with a meal.     [provider]  telmisartan-hydrochlorothiazide (MICARDIS HCT) 80-25 MG per tablet Take 1 tablet by mouth daily.    [provider]      Allergies    Oxybutynin    Review of Systems   Review of Systems  Cardiovascular:  Positive for chest pain.    Physical Exam Updated Vital Signs BP (!) 174/65   Pulse (!) 58   Temp 97.8 F (36.6 C) (Oral)   Resp 10   LMP 02/10/2015   SpO2 100%  Physical Exam Vitals and nursing note reviewed.  Constitutional:      Appearance: Normal appearance.  HENT:     Head: Normocephalic and atraumatic.     Mouth/Throat:     Mouth: Mucous membranes are moist.  Eyes:     General: No scleral icterus. Cardiovascular:     Rate and Rhythm: Normal rate and regular rhythm.  Pulses: Normal pulses.     Heart sounds: Normal heart sounds.  Pulmonary:     Effort: Pulmonary effort is normal.     Breath sounds: Normal breath sounds.  Abdominal:     General: Abdomen is flat.     Palpations: Abdomen is soft.     Tenderness: There is no abdominal tenderness.  Musculoskeletal:        General: No deformity.     Comments: Tenderness to palpation to upper left arm.  Skin:    General: Skin is warm.     Findings: No rash.  Neurological:     General: No focal deficit present.     Mental Status: She is alert.  Psychiatric:        Mood and Affect: Mood normal.     ED Results / Procedures / Treatments   Labs (all labs ordered are listed, but only abnormal results are displayed) Labs Reviewed  BASIC METABOLIC PANEL - Abnormal; Notable for  the following components:      Result Value   Glucose, Bld 170 (*)    All other components within normal limits  CBC - Abnormal; Notable for the following components:   MCH 25.7 (*)    All other components within normal limits  D-DIMER, QUANTITATIVE - Abnormal; Notable for the following components:   D-Dimer, Quant 2.01 (*)    All other components within normal limits  TROPONIN I (HIGH SENSITIVITY)  TROPONIN I (HIGH SENSITIVITY)    EKG EKG Interpretation  Date/Time:  Friday November 18 2022 11:49:03 EDT Ventricular Rate:  64 PR Interval:  164 QRS Duration: 80 QT Interval:  408 QTC Calculation: 420 R Axis:   51 Text Interpretation: Normal sinus rhythm Normal ECG When compared with ECG of 16-Nov-2022 12:32, PREVIOUS ECG IS PRESENT Confirmed by Gwyneth Sprout ([id number]) on 11/18/2022 2:23:14 PM  Radiology CT Angio Chest PE W and/or Wo Contrast  Result Date: 11/18/2022 CLINICAL DATA:  Chest pain and elevated D-dimer. EXAM: CT ANGIOGRAPHY CHEST WITH CONTRAST TECHNIQUE: Multidetector CT imaging of the chest was performed using the standard protocol during bolus administration of intravenous contrast. Multiplanar CT image reconstructions and MIPs were obtained to evaluate the vascular anatomy. RADIATION DOSE REDUCTION: This exam was performed according to the departmental dose-optimization program which includes automated exposure control, adjustment of the mA and/or kV according to patient size and/or use of iterative reconstruction technique. CONTRAST:  75mL OMNIPAQUE IOHEXOL 350 MG/ML SOLN COMPARISON:  May 23, 2007 FINDINGS: Cardiovascular: There is mild calcification of the aortic arch, without evidence of aortic aneurysm or dissection. Satisfactory opacification of the pulmonary arteries to the segmental level. No evidence of pulmonary embolism. Normal heart size. No pericardial effusion. Mediastinum/Nodes: No enlarged mediastinal, hilar, or axillary lymph nodes. Thyroid gland, trachea, and  esophagus demonstrate no significant findings. Lungs/Pleura: There is no evidence of an acute infiltrate, pleural effusion or pneumothorax. Upper Abdomen: Multiple surgical clips are seen within the gallbladder fossa. Noninflamed diverticula are seen along the splenic flexure. Musculoskeletal: No chest wall abnormality. No acute or significant osseous findings. Review of the MIP images confirms the above findings. IMPRESSION: 1. No evidence of pulmonary embolism or acute cardiopulmonary disease. 2. Evidence of prior cholecystectomy. 3. Colonic diverticulosis. 4. Aortic atherosclerosis. Aortic Atherosclerosis (ICD10-I70.0). Electronically Signed   By: Aram Candela M.D.   On: 11/18/2022 20:08   DG Chest 2 View  Result Date: 11/18/2022 CLINICAL DATA:  Chest pain EXAM: CHEST - 2 VIEW COMPARISON:  CXR 12/20/18 FINDINGS: The heart size  and mediastinal contours are within normal limits. Both lungs are clear. The visualized skeletal structures are unremarkable. Surgical clips in the right upper quadrant. IMPRESSION: No active pulmonary disease. Electronically Signed   By: Lorenza Cambridge M.D.   On: 11/18/2022 12:29    Procedures Procedures    Medications Ordered in ED Medications  oxyCODONE-acetaminophen (PERCOCET/ROXICET) 5-325 MG per tablet 1 tablet (1 tablet Oral Given 11/18/22 1524)  iohexol (OMNIPAQUE) 350 MG/ML injection 75 mL (75 mLs Intravenous Contrast Given 11/18/22 1943)    ED Course/ Medical Decision Making/ A&P                             Medical Decision Making Amount and/or Complexity of Data Reviewed Labs: ordered. Radiology: ordered.  Risk Prescription drug management.   This patient presents to the ED for chest pain, arm pain, this involves an extensive number of treatment options, and is a complaint that carries with a high risk of complications and morbidity.  The differential diagnosis includes ACS, pericarditis, PE, DVT, pneumothorax, pneumonia, less likely dissection with  essentially normal blood pressure, symmetric bilateral pulses, and no back pain.  This is not an exhaustive list.  Lab tests: I ordered and personally interpreted labs.  The pertinent results include: WBC unremarkable. Hbg unremarkable. Platelets unremarkable. Electrolytes unremarkable. BUN, creatinine unremarkable.  D-dimer 2.01.  Imaging studies: I ordered imaging studies. I personally reviewed, interpreted imaging and agree with the radiologist's interpretations. The results include: CT angio chest with no PE.  Chest x-ray with no acute cardiothoracic abnormalities.  Problem list/ ED course/ Critical interventions/ Medical management: HPI: See above Vital signs within normal range and stable throughout visit. Laboratory/imaging studies significant for: See above. On physical examination, patient is afebrile and appears in no acute distress.  Exam without evidence of volume overload so doubt heart failure.  Given the timing of pain to ER presentation, delta troponin was negative, EKG without active ischemia so doubt ACS/MI.  Based on CT angio chest no evidence of PE.  Chest x-ray showed no pneumonia or pneumothorax.  I ordered ultrasound study to rule out DVT of the left arm.  However at the time of ordering ultrasound was not available.  I discussed with patient that she can have an outpatient ultrasound study done.  She is agreeable to the plan.  Given Percocet for pain.  Reevaluation of patient after this medication showed the patient improved.  Heart score 2 so plan to discharge patient home with cardiology follow-up. I have reviewed the patient home medicines and have made adjustments as needed.  Cardiac monitoring/EKG: The patient was maintained on a cardiac monitor.  I personally reviewed and interpreted the cardiac monitor which showed an underlying rhythm of: sinus rhythm.  Additional history obtained: External records from outside source obtained and reviewed including: Chart review  including previous notes, labs, imaging.  Consultations obtained:  Disposition Continued outpatient therapy. Follow-up with PCP and cardiology recommended for reevaluation of symptoms. Treatment plan discussed with patient.  Pt acknowledged understanding was agreeable to the plan. Worrisome signs and symptoms were discussed with patient, and patient acknowledged understanding to return to the ED if they noticed these signs and symptoms. Patient was stable upon discharge.   This chart was dictated using voice recognition software.  Despite best efforts to proofread,  errors can occur which can change the documentation meaning.          Final Clinical Impression(s) / ED Diagnoses Final  diagnoses:  Nonspecific chest pain  Left arm pain    Rx / DC Orders ED Discharge Orders          Ordered    Ambulatory referral to Cardiology       Comments: If you have not heard from the Cardiology office within the next 72 hours please call [phone].   11/18/22 2157              Jeanelle Malling, PA 11/19/22 2231    Gwyneth Sprout, MD 11/21/22 [phone]

## 2022-11-19 NOTE — ED Notes (Signed)
Pt asking if she can eat at this time.  This RN notified provider of pt's inquiry.  Per provider, pt is able to eat.

## 2022-11-23 ENCOUNTER — Other Ambulatory Visit: Payer: Self-pay | Admitting: Internal Medicine

## 2022-11-23 DIAGNOSIS — I82622 Acute embolism and thrombosis of deep veins of left upper extremity: Secondary | ICD-10-CM

## 2022-12-01 ENCOUNTER — Ambulatory Visit
Admission: RE | Admit: 2022-12-01 | Discharge: 2022-12-01 | Disposition: A | Discharge status: Home / Self Care | Payer: 59 | Source: Ambulatory Visit | Attending: Internal Medicine | Admitting: Internal Medicine

## 2022-12-01 DIAGNOSIS — I82622 Acute embolism and thrombosis of deep veins of left upper extremity: Secondary | ICD-10-CM | POA: Insufficient documentation

## 2022-12-07 ENCOUNTER — Other Ambulatory Visit: Payer: Self-pay | Admitting: Internal Medicine

## 2022-12-07 DIAGNOSIS — R0789 Other chest pain: Secondary | ICD-10-CM

## 2022-12-07 DIAGNOSIS — I82722 Chronic embolism and thrombosis of deep veins of left upper extremity: Secondary | ICD-10-CM

## 2022-12-07 DIAGNOSIS — I2089 Other forms of angina pectoris: Secondary | ICD-10-CM

## 2022-12-20 ENCOUNTER — Ambulatory Visit: Payer: 59 | Admitting: Cardiology

## 2023-01-02 ENCOUNTER — Other Ambulatory Visit (HOSPITAL_COMMUNITY): Payer: Self-pay | Admitting: Emergency Medicine

## 2023-01-02 ENCOUNTER — Encounter (HOSPITAL_COMMUNITY): Payer: Self-pay

## 2023-01-02 MED ORDER — IVABRADINE HCL 5 MG PO TABS: 15.0000 mg | ORAL_TABLET | Freq: Once | ORAL | 0 refills | Status: AC

## 2023-01-02 MED ORDER — METOPROLOL TARTRATE 100 MG PO TABS: 100.0000 mg | ORAL_TABLET | Freq: Once | ORAL | 0 refills | Status: DC

## 2023-01-03 ENCOUNTER — Telehealth (HOSPITAL_COMMUNITY): Payer: Self-pay | Admitting: Emergency Medicine

## 2023-01-03 ENCOUNTER — Other Ambulatory Visit (HOSPITAL_COMMUNITY): Payer: Self-pay | Admitting: Emergency Medicine

## 2023-01-03 DIAGNOSIS — R079 Chest pain, unspecified: Secondary | ICD-10-CM

## 2023-01-03 NOTE — Telephone Encounter (Signed)
Reaching out to patient to offer assistance regarding upcoming cardiac imaging study; pt verbalizes understanding of appt date/time, parking situation and where to check in, pre-test NPO status and medications ordered, and verified current allergies; name and call back number provided for further questions should they arise Rockwell Alexandria RN Navigator Cardiac Imaging [facility] [phone] office [phone] cell  100mg  metop 2 hr prior Unable to get ivabradine as it was not in stock at her preferred pharm. Pt is okay to get IV doses on arrival if needed for HR control. -Leavy Heatherly

## 2023-01-04 ENCOUNTER — Ambulatory Visit
Admission: RE | Admit: 2023-01-04 | Discharge: 2023-01-04 | Disposition: A | Discharge status: Home / Self Care | Payer: 59 | Source: Ambulatory Visit | Attending: Internal Medicine | Admitting: Internal Medicine

## 2023-01-04 DIAGNOSIS — R0789 Other chest pain: Secondary | ICD-10-CM | POA: Diagnosis present

## 2023-01-04 DIAGNOSIS — I2089 Other forms of angina pectoris: Secondary | ICD-10-CM | POA: Insufficient documentation

## 2023-01-04 DIAGNOSIS — I82722 Chronic embolism and thrombosis of deep veins of left upper extremity: Secondary | ICD-10-CM | POA: Diagnosis present

## 2023-01-04 LAB — POCT I-STAT CREATININE: Creatinine, Ser: 0.8 mg/dL (ref 0.44–1.00)

## 2023-01-04 MED ORDER — METOPROLOL TARTRATE 5 MG/5ML IV SOLN
5.0000 mg | Freq: Once | INTRAVENOUS | Status: AC
  Administered 2023-01-04: 5 mg via INTRAVENOUS
  Filled 2023-01-04: qty 5

## 2023-01-04 MED ORDER — IOHEXOL 350 MG/ML SOLN
80.0000 mL | Freq: Once | INTRAVENOUS | Status: AC | PRN
  Administered 2023-01-04: 80 mL via INTRAVENOUS

## 2023-01-04 MED ORDER — NITROGLYCERIN 0.4 MG SL SUBL
0.8000 mg | SUBLINGUAL_TABLET | Freq: Once | SUBLINGUAL | Status: AC
  Administered 2023-01-04: 0.8 mg via SUBLINGUAL

## 2023-01-04 NOTE — Progress Notes (Signed)

## 2023-02-09 ENCOUNTER — Other Ambulatory Visit: Payer: Self-pay

## 2023-02-09 ENCOUNTER — Encounter (HOSPITAL_BASED_OUTPATIENT_CLINIC_OR_DEPARTMENT_OTHER): Payer: Self-pay | Admitting: Obstetrics and Gynecology

## 2023-02-09 NOTE — Progress Notes (Signed)
Spoke w/ via phone for pre-op interview---pt Lab needs dos----I stat               Lab results------EKG 11-18-2022 chart/epic COVID test -----patient states asymptomatic no test needed Arrive at -------1030 02-22-2023 NPO after MN NO Solid Food.  Clear liquids from MN until---930 Med rec completed Medications to take morning of surgery -----diltiazem, synthroid, crestor Diabetic medication -----none day of surgery Patient instructed no nail polish to be worn day of surgery Patient instructed to bring photo id and insurance card day of surgery Patient aware to have Driver (ride ) / caregiver    for 24 hours after surgery  roy husband Patient Special Instructions -----none Pre-Op special Instructions -----none Patient verbalized understanding of instructions that were given at this phone interview. Patient denies shortness of breath, chest pain, fever, cough at this phone interview.  Anesthesia Review: chest pain 11-18-2022, saw cardiology, no chest pain since 11-18-2022 htn, dm type 2,  Cardiac clearance note dr Juliann Pares dated 01-26-2023 on chart for 02-22-2023 surgery  PCP:dr kalisetti Cardiologist : dr Lillia Abed 01-26-2023 chart/[facility] Chest x-ray :11-18-2022 epic, ct angio chest 11-18-2022 epic Ct coronary morph 01-04-2023 epic EKG :11-18-2022 epic Echo : 12-07-2022 [facility] Stress test:none Cardiac Cath : none Activity level: does housework, can climb flight of stairs without problem Sleep Study/ CPAP :none Fasting Blood Sugar :      / Checks Blood Sugar -some days   ASA / Instructions/ Last Dose : pt told by pcp dr Nemiah Commander to sop 81 mg asa 5 days before surgery

## 2023-02-19 ENCOUNTER — Other Ambulatory Visit: Payer: Self-pay | Admitting: Obstetrics and Gynecology

## 2023-02-19 DIAGNOSIS — Z01818 Encounter for other preprocedural examination: Secondary | ICD-10-CM

## 2023-02-19 NOTE — H&P (Signed)
62 y.o. with thickened endometrium.  Previously: "Pt. here for pre op visit; D&C hysteroscopy scheduled on 09/11; denies any bleeding since March; Pt. saw Dr. Loreta Ave on Tues. for fluttering sensation in lower abdomen, was given probiotic but hasn't started it yet/tb//  Previously:"[facility]. Pap done, Mammo dx done in may. [place] f/u for fibroids and thickened endo(mild) back in April. No bleeding since then.  Physician interpretation: uterus still with multiple fibroids. EM now 5.89 with some anechoic areas inside; slightly larger than in April. Normal ovaries./mah.  Now with increase in size and anechoic areas, need bx. D/w pt options of EMB vs D&C, hysteroscopy and advantages and disadvantages for both. Pt leaning toward in office d&c, "  No bleeding.   Past Medical History:  Diagnosis Date   Anemia    Arthritis    knees & back arithrits    Chest pain    Diabetes mellitus type 2    Hypertension    Hypothyroidism    Multiple thyroid nodules    Osteoarthritis    Urgency of urination    Past Surgical History:  Procedure Laterality Date   CARDIAC CATHETERIZATION  2008   results normal per pt   CHOLECYSTECTOMY     KNEE SURGERY Right    arthroscopy   right knee replacement  2022   TOTAL KNEE ARTHROPLASTY Left 06/17/2014   Procedure: LEFT TOTAL KNEE ARTHROPLASTY;  Surgeon: Velna Ochs, MD;  Location: [facility] OR;  Service: Orthopedics;  Laterality: Left;   TUBAL LIGATION  1994    Social History   Socioeconomic History   Marital status: Married    Spouse name: Not on file   Number of children: Not on file   Years of education: Not on file   Highest education level: Not on file  Occupational History   Not on file  Tobacco Use   Smoking status: Never   Smokeless tobacco: Never  Vaping Use   Vaping status: Never Used  Substance and Sexual Activity   Alcohol use: No   Drug use: No   Sexual activity: Yes  Other Topics Concern   Not on file  Social History Narrative   Not on file    Social Determinants of Health   Financial Resource Strain: Low Risk  (01/19/2023)   Received from [facility]   Overall Financial Resource Strain (CARDIA)    Difficulty of Paying Living Expenses: Not hard at all  Food Insecurity: No Food Insecurity (01/19/2023)   Received from [facility]   Hunger Vital Sign    Worried About Running Out of Food in the Last Year: Never true    Ran Out of Food in the Last Year: Never true  Transportation Needs: No Transportation Needs (01/19/2023)   Received from [facility] - [facility]    In the past 12 months, has lack of transportation kept you from medical appointments or from getting medications?: No    Lack of Transportation (Non-Medical): No  Physical Activity: Not on file  Stress: Not on file  Social Connections: Not on file  Intimate Partner Violence: Not on file    No current facility-administered medications on file prior to encounter.   Current Outpatient Medications on File Prior to Encounter  Medication Sig Dispense Refill   rosuvastatin (CRESTOR) 5 MG tablet Take 2.5 mg by mouth daily.     aspirin 81 MG chewable tablet Chew 81 mg by mouth daily.     COVID-19  mRNA bivalent vaccine, Pfizer, (PFIZER COVID-19 VAC BIVALENT) injection Inject into the muscle. 0.3 mL 0   diltiazem (DILACOR XR) 120 MG 24 hr capsule Take 120 mg by mouth daily.     levothyroxine (SYNTHROID) 75 MCG tablet Take 50 mcg by mouth daily before breakfast.     metFORMIN (GLUCOPHAGE) 500 MG tablet Take 500 mg by mouth 2 (two) times daily with a meal.      telmisartan-hydrochlorothiazide (MICARDIS HCT) 80-25 MG per tablet Take 1 tablet by mouth daily.      Allergies  Allergen Reactions   Oxybutynin Palpitations    Vitals:   02/09/23 1452  Weight: 116.6 kg  Height: 5\' 11"  (1.803 m)    Lungs: clear to ascultation Cor:  RRR Abdomen:  soft, nontender, nondistended. Ex:  no cords, erythema Pelvic:    Vulva: no masses, no atrophy, no lesions Vagina: no tenderness, no erythema, no abnormal vaginal discharge, no vesicle(s) or ulcers, no cystocele, no rectocele Cervix: grossly normal, no discharge, no cervical motion tenderness Uterus: normal size (7), normal shape, midline, no uterine prolapse, non-tender Bladder/Urethra: normal meatus, no urethral discharge, no urethral mass, bladder non distended, Urethra well supported Adnexa/Parametria: no parametrial tenderness, no parametrial mass, no adnexal tenderness, no ovarian mass (no masses or pain on exam)  A:  For d&c, hysterscopy.     P: P: All risks, benefits and alternatives d/w patient and she desires to proceed.  Patient has undergone an ERAS protocol and SCDs during the operation.    Loney Laurence

## 2023-02-22 ENCOUNTER — Ambulatory Visit (HOSPITAL_BASED_OUTPATIENT_CLINIC_OR_DEPARTMENT_OTHER): Payer: 59 | Admitting: Certified Registered Nurse Anesthetist

## 2023-02-22 ENCOUNTER — Encounter (HOSPITAL_BASED_OUTPATIENT_CLINIC_OR_DEPARTMENT_OTHER): Payer: Self-pay | Admitting: Obstetrics and Gynecology

## 2023-02-22 ENCOUNTER — Encounter (HOSPITAL_BASED_OUTPATIENT_CLINIC_OR_DEPARTMENT_OTHER)
Admission: RE | Disposition: A | Discharge status: Home / Self Care | Payer: Self-pay | Source: Ambulatory Visit | Attending: Obstetrics and Gynecology

## 2023-02-22 ENCOUNTER — Other Ambulatory Visit: Payer: Self-pay

## 2023-02-22 ENCOUNTER — Ambulatory Visit (HOSPITAL_BASED_OUTPATIENT_CLINIC_OR_DEPARTMENT_OTHER)
Admission: RE | Admit: 2023-02-22 | Discharge: 2023-02-22 | Disposition: A | Discharge status: Home / Self Care | Payer: 59 | Source: Ambulatory Visit | Attending: Obstetrics and Gynecology | Admitting: Obstetrics and Gynecology

## 2023-02-22 DIAGNOSIS — E119 Type 2 diabetes mellitus without complications: Secondary | ICD-10-CM | POA: Insufficient documentation

## 2023-02-22 DIAGNOSIS — N840 Polyp of corpus uteri: Secondary | ICD-10-CM | POA: Insufficient documentation

## 2023-02-22 DIAGNOSIS — Z01818 Encounter for other preprocedural examination: Secondary | ICD-10-CM

## 2023-02-22 DIAGNOSIS — R9389 Abnormal findings on diagnostic imaging of other specified body structures: Secondary | ICD-10-CM | POA: Diagnosis present

## 2023-02-22 DIAGNOSIS — E039 Hypothyroidism, unspecified: Secondary | ICD-10-CM | POA: Diagnosis not present

## 2023-02-22 DIAGNOSIS — I10 Essential (primary) hypertension: Secondary | ICD-10-CM | POA: Insufficient documentation

## 2023-02-22 HISTORY — PX: HYSTEROSCOPY WITH D & C: SHX1775

## 2023-02-22 LAB — TYPE AND SCREEN
ABO/RH(D): O POS
Antibody Screen: NEGATIVE

## 2023-02-22 LAB — POCT I-STAT, CHEM 8
BUN: 9 mg/dL (ref 8–23)
Calcium, Ion: 0.94 mmol/L — ABNORMAL LOW (ref 1.15–1.40)
Chloride: 105 mmol/L (ref 98–111)
Creatinine, Ser: 0.7 mg/dL (ref 0.44–1.00)
Glucose, Bld: 126 mg/dL — ABNORMAL HIGH (ref 70–99)
HCT: 37 % (ref 36.0–46.0)
Hemoglobin: 12.6 g/dL (ref 12.0–15.0)
Potassium: 3.5 mmol/L (ref 3.5–5.1)
Sodium: 139 mmol/L (ref 135–145)
TCO2: 23 mmol/L (ref 22–32)

## 2023-02-22 LAB — CBC
HCT: 36.3 % (ref 36.0–46.0)
Hemoglobin: 11.3 g/dL — ABNORMAL LOW (ref 12.0–15.0)
MCH: 25.2 pg — ABNORMAL LOW (ref 26.0–34.0)
MCHC: 31.1 g/dL (ref 30.0–36.0)
MCV: 80.8 fL (ref 80.0–100.0)
Platelets: 348 10*3/uL (ref 150–400)
RBC: 4.49 MIL/uL (ref 3.87–5.11)
RDW: 14.2 % (ref 11.5–15.5)
WBC: 5.6 10*3/uL (ref 4.0–10.5)
nRBC: 0 % (ref 0.0–0.2)

## 2023-02-22 LAB — GLUCOSE, CAPILLARY: Glucose-Capillary: 90 mg/dL (ref 70–99)

## 2023-02-22 SURGERY — DILATATION AND CURETTAGE /HYSTEROSCOPY
Anesthesia: General | Site: Uterus

## 2023-02-22 MED ORDER — OXYCODONE HCL 5 MG PO TABS: 5.0000 mg | ORAL_TABLET | Freq: Once | ORAL | Status: DC | PRN

## 2023-02-22 MED ORDER — FENTANYL CITRATE (PF) 100 MCG/2ML IJ SOLN
INTRAMUSCULAR | Status: DC | PRN
  Administered 2023-02-22: 50 ug via INTRAVENOUS

## 2023-02-22 MED ORDER — PHENYLEPHRINE HCL (PRESSORS) 10 MG/ML IV SOLN
INTRAVENOUS | Status: DC | PRN
  Administered 2023-02-22: 80 ug via INTRAVENOUS

## 2023-02-22 MED ORDER — DEXAMETHASONE SODIUM PHOSPHATE 10 MG/ML IJ SOLN
INTRAMUSCULAR | Status: DC | PRN
  Administered 2023-02-22: 5 mg via INTRAVENOUS

## 2023-02-22 MED ORDER — MIDAZOLAM HCL 2 MG/2ML IJ SOLN
INTRAMUSCULAR | Status: AC
  Filled 2023-02-22: qty 2

## 2023-02-22 MED ORDER — OXYCODONE HCL 5 MG/5ML PO SOLN: 5.0000 mg | Freq: Once | ORAL | Status: DC | PRN

## 2023-02-22 MED ORDER — SODIUM CHLORIDE 0.9 % IR SOLN
Status: DC | PRN
  Administered 2023-02-22: 3000 mL

## 2023-02-22 MED ORDER — FENTANYL CITRATE (PF) 100 MCG/2ML IJ SOLN
INTRAMUSCULAR | Status: AC
  Filled 2023-02-22: qty 2

## 2023-02-22 MED ORDER — ACETAMINOPHEN 500 MG PO TABS
1000.0000 mg | ORAL_TABLET | Freq: Once | ORAL | Status: AC
  Administered 2023-02-22: 1000 mg via ORAL

## 2023-02-22 MED ORDER — DEXAMETHASONE SODIUM PHOSPHATE 10 MG/ML IJ SOLN
INTRAMUSCULAR | Status: AC
  Filled 2023-02-22: qty 1

## 2023-02-22 MED ORDER — FENTANYL CITRATE (PF) 100 MCG/2ML IJ SOLN: 25.0000 ug | INTRAMUSCULAR | Status: DC | PRN

## 2023-02-22 MED ORDER — SOD CITRATE-CITRIC ACID 500-334 MG/5ML PO SOLN: 30.0000 mL | ORAL | Status: DC

## 2023-02-22 MED ORDER — LIDOCAINE HCL (PF) 2 % IJ SOLN
INTRAMUSCULAR | Status: AC
  Filled 2023-02-22: qty 5

## 2023-02-22 MED ORDER — LACTATED RINGERS IV SOLN: INTRAVENOUS | Status: DC | PRN

## 2023-02-22 MED ORDER — PROPOFOL 10 MG/ML IV BOLUS
INTRAVENOUS | Status: DC | PRN
  Administered 2023-02-22: 200 mg via INTRAVENOUS

## 2023-02-22 MED ORDER — POVIDONE-IODINE 10 % EX SWAB: 2.0000 | Freq: Once | CUTANEOUS | Status: DC

## 2023-02-22 MED ORDER — PROPOFOL 10 MG/ML IV BOLUS
INTRAVENOUS | Status: AC
  Filled 2023-02-22: qty 20

## 2023-02-22 MED ORDER — LACTATED RINGERS IV SOLN: INTRAVENOUS | Status: DC

## 2023-02-22 MED ORDER — ONDANSETRON HCL 4 MG/2ML IJ SOLN
INTRAMUSCULAR | Status: AC
  Filled 2023-02-22: qty 2

## 2023-02-22 MED ORDER — LIDOCAINE HCL (CARDIAC) PF 100 MG/5ML IV SOSY
PREFILLED_SYRINGE | INTRAVENOUS | Status: DC | PRN
  Administered 2023-02-22: 100 mg via INTRAVENOUS

## 2023-02-22 MED ORDER — ONDANSETRON HCL 4 MG/2ML IJ SOLN
INTRAMUSCULAR | Status: DC | PRN
  Administered 2023-02-22: 4 mg via INTRAVENOUS

## 2023-02-22 MED ORDER — ACETAMINOPHEN 500 MG PO TABS
ORAL_TABLET | ORAL | Status: AC
  Filled 2023-02-22: qty 2

## 2023-02-22 MED ORDER — DROPERIDOL 2.5 MG/ML IJ SOLN: 0.6250 mg | Freq: Once | INTRAMUSCULAR | Status: DC | PRN

## 2023-02-22 MED ORDER — MIDAZOLAM HCL 2 MG/2ML IJ SOLN
INTRAMUSCULAR | Status: DC | PRN
  Administered 2023-02-22: 2 mg via INTRAVENOUS

## 2023-02-22 SURGICAL SUPPLY — 18 items
ABLATOR SURESOUND NOVASURE (ABLATOR) IMPLANT
CATH ROBINSON RED A/P 16FR (CATHETERS) ×1 IMPLANT
DILATOR CANAL MILEX (MISCELLANEOUS) IMPLANT
DRSG TELFA 3X8 NADH STRL (GAUZE/BANDAGES/DRESSINGS) ×1 IMPLANT
ELECT REM PT RETURN 9FT ADLT (ELECTROSURGICAL)
ELECTRODE REM PT RTRN 9FT ADLT (ELECTROSURGICAL) IMPLANT
GAUZE 4X4 16PLY ~~LOC~~+RFID DBL (SPONGE) ×1 IMPLANT
GLOVE BIO SURGEON STRL SZ7 (GLOVE) ×1 IMPLANT
GOWN STRL REUS W/TWL XL LVL3 (GOWN DISPOSABLE) ×1 IMPLANT
KIT PROCEDURE FLUENT (KITS) ×1 IMPLANT
KIT TURNOVER CYSTO (KITS) ×1 IMPLANT
LOOP CUTTING BIPOLAR 21FR (ELECTRODE) IMPLANT
PACK VAGINAL MINOR WOMEN LF (CUSTOM PROCEDURE TRAY) ×1 IMPLANT
PAD OB MATERNITY 4.3X12.25 (PERSONAL CARE ITEMS) ×1 IMPLANT
SEAL ROD LENS SCOPE MYOSURE (ABLATOR) ×2 IMPLANT
SLEEVE SCD COMPRESS KNEE MED (STOCKING) ×1 IMPLANT
TOWEL OR 17X24 6PK STRL BLUE (TOWEL DISPOSABLE) ×1 IMPLANT
WATER STERILE IRR 500ML POUR (IV SOLUTION) ×1 IMPLANT

## 2023-02-22 NOTE — Anesthesia Procedure Notes (Signed)
Procedure Name: LMA Insertion Date/Time: 02/22/2023 12:21 PM  Performed by: Karen Kitchens, CRNAPre-anesthesia Checklist: Patient identified, Emergency Drugs available, Suction available and Patient being monitored Patient Re-evaluated:Patient Re-evaluated prior to induction Oxygen Delivery Method: Circle system utilized Preoxygenation: Pre-oxygenation with 100% oxygen Induction Type: IV induction Ventilation: Mask ventilation without difficulty LMA: LMA inserted LMA Size: 4.0 Number of attempts: 1 Airway Equipment and Method: Bite block Placement Confirmation: positive ETCO2, breath sounds checked- equal and bilateral and CO2 detector Tube secured with: Tape Dental Injury: Teeth and Oropharynx as per pre-operative assessment

## 2023-02-22 NOTE — Anesthesia Postprocedure Evaluation (Signed)
Anesthesia Post Note  Patient: Meghan Morales  Procedure(s) Performed: DILATATION AND CURETTAGE /HYSTEROSCOPY (Uterus)     Patient location during evaluation: PACU Anesthesia Type: General Level of consciousness: awake and alert Pain management: pain level controlled Vital Signs Assessment: post-procedure vital signs reviewed and stable Respiratory status: spontaneous breathing, nonlabored ventilation and respiratory function stable Cardiovascular status: blood pressure returned to baseline Postop Assessment: no apparent nausea or vomiting Anesthetic complications: no   No notable events documented.  Last Vitals:  Vitals:   02/22/23 1037 02/22/23 1245  BP: (!) 162/74 (!) 144/75  Pulse: 72 61  Resp: 18 11  Temp: 36.8 C 36.5 C  SpO2: 98% 100%    Last Pain:  Vitals:   02/22/23 1245  TempSrc:   PainSc: 0-No pain                 Shanda Howells

## 2023-02-22 NOTE — Discharge Instructions (Addendum)
No acetaminophen/Tylenol until after 5:00pm today if needed for pain for pain.     Post Anesthesia Home Care Instructions  Activity: Get plenty of rest for the remainder of the day. A responsible individual must stay with you for 24 hours following the procedure.  For the next 24 hours, DO NOT: -Drive a car -Advertising copywriter -Drink alcoholic beverages -Take any medication unless instructed by your physician -Make any legal decisions or sign important papers.  Meals: Start with liquid foods such as gelatin or soup. Progress to regular foods as tolerated. Avoid greasy, spicy, heavy foods. If nausea and/or vomiting occur, drink only clear liquids until the nausea and/or vomiting subsides. Call your physician if vomiting continues.  Special Instructions/Symptoms: Your throat may feel dry or sore from the anesthesia or the breathing tube placed in your throat during surgery. If this causes discomfort, gargle with warm salt water. The discomfort should disappear within 24 hours.

## 2023-02-22 NOTE — Transfer of Care (Signed)
Immediate Anesthesia Transfer of Care Note  Patient: Meghan Morales  Procedure(s) Performed: DILATATION AND CURETTAGE /HYSTEROSCOPY (Uterus)  Patient Location: PACU  Anesthesia Type:General  Level of Consciousness: awake, alert , and oriented  Airway & Oxygen Therapy: Patient Spontanous Breathing and Patient connected to face mask oxygen  Post-op Assessment: Report given to RN and Post -op Vital signs reviewed and stable  Post vital signs: Reviewed and stable  Last Vitals:  Vitals Value Taken Time  BP 144/75 02/22/23 1245  Temp    Pulse 65 02/22/23 1249  Resp 13 02/22/23 1249  SpO2 100 % 02/22/23 1249  Vitals shown include unfiled device data.  Last Pain:  Vitals:   02/22/23 1037  TempSrc: Oral         Complications: No notable events documented.

## 2023-02-22 NOTE — Brief Op Note (Signed)
02/22/2023  12:36 PM  PATIENT:  Henrine Screws  62 y.o. female  PRE-OPERATIVE DIAGNOSIS:  thickened endometrium  POST-OPERATIVE DIAGNOSIS:  thickened endometrium  PROCEDURE:  Procedure(s): DILATATION AND CURETTAGE /HYSTEROSCOPY (N/A)  SURGEON:  Surgeons and Role:    * [name], MD - Primary  ANESTHESIA:   general  EBL:  25 cc   LOCAL MEDICATIONS USED:  NONE  SPECIMEN:  Source of Specimen:  uterine currettings and polyp  DISPOSITION OF SPECIMEN:  PATHOLOGY  COUNTS:  YES  TOURNIQUET:  * No tourniquets in log *  DICTATION: .Note written in EPIC  PLAN OF CARE: Discharge to home after PACU  PATIENT DISPOSITION:  PACU - hemodynamically stable.   Delay start of Pharmacological VTE agent (>24hrs) due to surgical blood loss or risk of bleeding: not applicable

## 2023-02-22 NOTE — Anesthesia Preprocedure Evaluation (Addendum)
Anesthesia Evaluation  Patient identified by MRN, date of birth, ID band Patient awake    Reviewed: Allergy & Precautions, NPO status , Patient's Chart, lab work & pertinent test results  History of Anesthesia Complications Negative for: history of anesthetic complications  Airway Mallampati: II  TM Distance: >3 FB Neck ROM: Full    Dental no notable dental hx.    Pulmonary neg pulmonary ROS   Pulmonary exam normal        Cardiovascular hypertension, Pt. on medications Normal cardiovascular exam     Neuro/Psych negative neurological ROS     GI/Hepatic negative GI ROS, Neg liver ROS,,,  Endo/Other  diabetes, Type 2, Oral Hypoglycemic AgentsHypothyroidism    Renal/GU negative Renal ROS     Musculoskeletal  (+) Arthritis ,    Abdominal   Peds  Hematology negative hematology ROS (+)   Anesthesia Other Findings Day of surgery medications reviewed with patient.  Reproductive/Obstetrics thickened endometrium                              Anesthesia Physical Anesthesia Plan  ASA: 2  Anesthesia Plan: General   Post-op Pain Management: Tylenol PO (pre-op)*   Induction: Intravenous  PONV Risk Score and Plan: 3 and Treatment may vary due to age or medical condition, Ondansetron, Dexamethasone and Midazolam  Airway Management Planned: LMA  Additional Equipment: None  Intra-op Plan:   Post-operative Plan: Extubation in OR  Informed Consent: I have reviewed the patients History and Physical, chart, labs and discussed the procedure including the risks, benefits and alternatives for the proposed anesthesia with the patient or authorized representative who has indicated his/her understanding and acceptance.     Dental advisory given  Plan Discussed with: CRNA  Anesthesia Plan Comments:         Anesthesia Quick Evaluation

## 2023-02-22 NOTE — Op Note (Signed)
  02/22/2023   12:36 PM   PATIENT:  Meghan Morales  62 y.o. female   PRE-OPERATIVE DIAGNOSIS:  thickened endometrium   POST-OPERATIVE DIAGNOSIS:  thickened endometrium   PROCEDURE:  Procedure(s): DILATATION AND CURETTAGE /HYSTEROSCOPY (N/Morales)   SURGEON:  Surgeons and Role:    * [name], MD - Primary   ANESTHESIA:   general   EBL:  25 cc    LOCAL MEDICATIONS USED:  NONE   SPECIMEN:  Source of Specimen:  uterine currettings and polyp   DISPOSITION OF SPECIMEN:  PATHOLOGY   COUNTS:  YES   TOURNIQUET:  * No tourniquets in log *   DICTATION: .Note written in EPIC   PLAN OF CARE: Discharge to home after PACU   PATIENT DISPOSITION:  PACU - hemodynamically stable.   Delay start of Pharmacological VTE agent (>24hrs) due to surgical blood loss or risk of bleeding: not applicable    Complications: none  Medications: none  Findings: small polyp and normal cavity otherwise  Technique:  After adequate anesthesia was achieved, the patient was prepped and draped in the usual sterile fashion.  The speculum was placed in the vagina and the cervix stabilized with Morales single-tooth tenaculum.  The cervix was dilated with pratt dilators and the hysteroscope passed inside the endometrial cavity.  The above findings were noted and polyp forceps were used to remove small pieces of tissue.  Sharp curettage was then performed and uterine curettings sent to path.  All instruments were removed from the vagina.  The patient tolerated the procedure well.    Meghan Morales

## 2023-02-22 NOTE — Progress Notes (Signed)
There has been no change in the patients history, status or exam since the history and physical.  Vitals:   02/09/23 1452 02/22/23 1037  BP:  (!) 162/74  Pulse:  72  Resp:  18  Temp:  98.2 F (36.8 C)  TempSrc:  Oral  SpO2:  98%  Weight: 116.6 kg 114.3 kg  Height: 5\' 11"  (1.803 m) 5\' 11"  (1.803 m)    No results found for this or any previous visit (from the past 72 hour(s)).  Meghan Morales

## 2023-02-23 ENCOUNTER — Encounter (HOSPITAL_BASED_OUTPATIENT_CLINIC_OR_DEPARTMENT_OTHER): Payer: Self-pay | Admitting: Obstetrics and Gynecology

## 2023-02-23 LAB — SURGICAL PATHOLOGY

## 2023-04-05 ENCOUNTER — Other Ambulatory Visit: Payer: Self-pay | Admitting: Internal Medicine

## 2023-04-05 DIAGNOSIS — Z1231 Encounter for screening mammogram for malignant neoplasm of breast: Secondary | ICD-10-CM

## 2023-04-14 ENCOUNTER — Other Ambulatory Visit: Payer: Self-pay

## 2023-04-14 MED ORDER — COVID-19 MRNA VAC-TRIS(PFIZER) 30 MCG/0.3ML IM SUSY
0.3000 mL | PREFILLED_SYRINGE | Freq: Once | INTRAMUSCULAR | 0 refills | Status: AC
Start: 2023-04-14 — End: 2023-04-15
  Filled 2023-04-14: qty 0.3, 1d supply, fill #0

## 2023-05-26 ENCOUNTER — Ambulatory Visit
Admission: RE | Admit: 2023-05-26 | Discharge: 2023-05-26 | Disposition: A | Discharge status: Home / Self Care | Payer: 59 | Source: Ambulatory Visit | Attending: Internal Medicine | Admitting: Internal Medicine

## 2023-05-26 DIAGNOSIS — Z1231 Encounter for screening mammogram for malignant neoplasm of breast: Secondary | ICD-10-CM

## 2023-07-19 ENCOUNTER — Ambulatory Visit
Admission: EM | Admit: 2023-07-19 | Discharge: 2023-07-19 | Disposition: A | Discharge status: Home / Self Care | Payer: 59 | Attending: Nurse Practitioner | Admitting: Nurse Practitioner

## 2023-07-19 DIAGNOSIS — R0982 Postnasal drip: Secondary | ICD-10-CM

## 2023-07-19 DIAGNOSIS — J069 Acute upper respiratory infection, unspecified: Secondary | ICD-10-CM | POA: Diagnosis not present

## 2023-07-19 LAB — POC COVID19/FLU A&B COMBO
Covid Antigen, POC: NEGATIVE
Influenza A Antigen, POC: NEGATIVE
Influenza B Antigen, POC: NEGATIVE

## 2023-07-19 NOTE — Discharge Instructions (Addendum)
 You have a viral upper respiratory infection.  Symptoms should improve over the next week to 10 days.  If you develop chest pain or shortness of breath, go to the emergency room.  Some things that can make you feel better are: - Increased rest - Increasing fluid with water/sugar free electrolytes - Acetaminophen  and ibuprofen  as needed for fever/pain - Salt water gargling, chloraseptic spray and throat lozenges for sore throat - Flonase nasal spray for post nasal drainage - OTC guaifenesin (Mucinex) 600 mg twice daily for congestion - Saline sinus flushes or a neti pot - Humidifying the air

## 2023-07-19 NOTE — ED Triage Notes (Signed)
Pt reports cough and sore throat, congestion, feels like she has a lump of mucus in her throat that she needs to cough up x 3 days

## 2023-07-19 NOTE — ED Provider Notes (Signed)
 [facility]    CSN: [id number] Arrival date & time: 07/19/23  1329      History   Chief Complaint No chief complaint on file.   HPI Meghan Morales is a 63 y.o. female.   Patient presents today with 3-day history of nasal congestion, postnasal drainage, scratchy throat, headache, fatigue, and occasional cough for mucus in the back of her throat.  Reports she has coughed up a little bit of blood at times.  No fever, bodyaches or chills, shortness of breath or chest pain, runny nose, ear pain, abdominal pain, nausea/vomiting, diarrhea, or change in appetite.  No known sick contacts.  Has been gargling with salt water and taking over-the-counter throat lozenges without much improvement in symptoms.    Past Medical History:  Diagnosis Date   Anemia    Arthritis    knees & back arithrits    Chest pain    Diabetes mellitus type 2    Hypertension    Hypothyroidism    Multiple thyroid  nodules    Osteoarthritis    Urgency of urination     Patient Active Problem List   Diagnosis Date Noted   Overactive bladder 07/25/2018   Menopausal syndrome 07/25/2018   Menorrhagia 07/25/2018   Pain in right knee 11/10/2017   Chronic anemia 01/05/2017   Chronic toe pain, right foot 01/05/2017   CRP elevated 05/12/2016   Elevated erythrocyte sedimentation rate 05/12/2016   History of normocytic normochromic anemia 05/12/2016   Atypical chest pain 04/22/2016   Urge incontinence 12/02/2015   Vitamin D deficiency 08/25/2015   Acquired hypothyroidism 08/19/2015   Essential hypertension 08/19/2015   Hot flushes, perimenopausal 08/19/2015   Type 2 diabetes mellitus without complication, without long-term current use of insulin  (HCC) 08/19/2015   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/13/2015   Presence of right artificial knee joint 07/13/2015   Primary osteoarthritis of left knee 06/17/2014   Obesity 06/17/2014   Diabetes (HCC) 06/17/2014   Primary  osteoarthritis of knee 06/17/2014    Past Surgical History:  Procedure Laterality Date   CARDIAC CATHETERIZATION  2008   results normal per pt   CHOLECYSTECTOMY     HYSTEROSCOPY WITH D & C N/A 02/22/2023   Procedure: DILATATION AND CURETTAGE /HYSTEROSCOPY;  Surgeon: Sarrah Browning, MD;  Location: [facility];  Service: Gynecology;  Laterality: N/A;   KNEE SURGERY Right    arthroscopy   right knee replacement  2022   TOTAL KNEE ARTHROPLASTY Left 06/17/2014   Procedure: LEFT TOTAL KNEE ARTHROPLASTY;  Surgeon: Maude KANDICE Herald, MD;  Location: [facility] OR;  Service: Orthopedics;  Laterality: Left;   TUBAL LIGATION  1994    OB History   No obstetric history on file.      Home Medications    Prior to Admission medications   Medication Sig Start Date End Date Taking? Authorizing Provider  aspirin  81 MG chewable tablet Chew 81 mg by mouth daily.    [provider]  COVID-19 mRNA bivalent vaccine, Pfizer, (PFIZER COVID-19 VAC BIVALENT) injection Inject into the muscle. 05/13/21   Luiz Channel, MD  diltiazem  (DILACOR XR ) 120 MG 24 hr capsule Take 120 mg by mouth daily.    [provider]  levothyroxine  (SYNTHROID ) 75 MCG tablet Take 50 mcg by mouth daily before breakfast.    [provider]  metFORMIN  (GLUCOPHAGE ) 500 MG tablet Take 500 mg by mouth 2 (two) times daily with a meal.     [provider]  rosuvastatin (CRESTOR) 5 MG tablet Take 2.5 mg by mouth daily.    [provider]  telmisartan -hydrochlorothiazide  (MICARDIS  HCT) 80-25 MG per tablet Take 1 tablet by mouth daily.    [provider]    Family History Family History  Problem Relation Age of Onset   Hypertension Mother    Stroke Father    Throat cancer Father    Alcohol abuse Father    Cancer Brother    Heart disease Paternal Grandmother    Breast cancer Neg Hx     Social History Social History   Tobacco Use   Smoking status: Never   Smokeless  tobacco: Never  Vaping Use   Vaping status: Never Used  Substance Use Topics   Alcohol use: No   Drug use: No     Allergies   Oxybutynin   Review of Systems Review of Systems Per HPI  Physical Exam Triage Vital Signs ED Triage Vitals  Encounter Vitals Group     BP 07/19/23 1453 (!) 158/76     Systolic BP Percentile --      Diastolic BP Percentile --      Pulse --      Resp 07/19/23 1453 18     Temp 07/19/23 1453 99.2 F (37.3 C)     Temp Source 07/19/23 1453 Oral     SpO2 07/19/23 1453 97 %     Weight --      Height --      Head Circumference --      Peak Flow --      Pain Score 07/19/23 1455 0     Pain Loc --      Pain Education --      Exclude from Growth Chart --    No data found.  Updated Vital Signs BP (!) 158/76 (BP Location: Right Arm)   Temp 99.2 F (37.3 C) (Oral)   Resp 18   LMP 02/10/2015   SpO2 97%   Heart rate: 82 bpm  Visual Acuity Right Eye Distance:   Left Eye Distance:   Bilateral Distance:    Right Eye Near:   Left Eye Near:    Bilateral Near:     Physical Exam Vitals and nursing note reviewed.  Constitutional:      General: She is not in acute distress.    Appearance: Normal appearance. She is not ill-appearing or toxic-appearing.  HENT:     Head: Normocephalic and atraumatic.     Right Ear: Tympanic membrane, ear canal and external ear normal.     Left Ear: Tympanic membrane, ear canal and external ear normal.     Nose: No congestion or rhinorrhea.     Mouth/Throat:     Mouth: Mucous membranes are moist.     Pharynx: Oropharynx is clear. Posterior oropharyngeal erythema present. No oropharyngeal exudate.  Cardiovascular:     Rate and Rhythm: Normal rate and regular rhythm.  Pulmonary:     Effort: Pulmonary effort is normal. No respiratory distress.     Breath sounds: Normal breath sounds. No wheezing, rhonchi or rales.  Musculoskeletal:     Cervical back: Normal range of motion and neck supple.  Lymphadenopathy:      Cervical: No cervical adenopathy.  Skin:    General: Skin is warm and dry.     Coloration: Skin is not jaundiced or pale.     Findings: No erythema or rash.  Neurological:     Mental Status: She is  alert and oriented to person, place, and time.  Psychiatric:        Behavior: Behavior is cooperative.      UC Treatments / Results  Labs (all labs ordered are listed, but only abnormal results are displayed) Labs Reviewed  POC COVID19/FLU A&B COMBO    EKG   Radiology No results found.  Procedures Procedures (including critical care time)  Medications Ordered in UC Medications - No data to display  Initial Impression / Assessment and Plan / UC Course  I have reviewed the triage vital signs and the nursing notes.  Pertinent labs & imaging results that were available during my care of the patient were reviewed by me and considered in my medical decision making (see chart for details).   Patient is well-appearing, normotensive, afebrile, not tachycardic, not tachypneic, oxygenating well on room air.    1. Viral URI with cough 2. Post-nasal drainage Suspect viral etiology COVID-19 and influenza testing is negative Supportive care discussed with patient including Flonase nasal spray, throat lozenges with lidocaine /tetracaine, Chloraseptic spray Return and ER precautions discussed with patient  The patient was given the opportunity to ask questions.  All questions answered to their satisfaction.  The patient is in agreement to this plan.    Final Clinical Impressions(s) / UC Diagnoses   Final diagnoses:  Viral URI with cough  Post-nasal drainage     Discharge Instructions      You have a viral upper respiratory infection.  Symptoms should improve over the next week to 10 days.  If you develop chest pain or shortness of breath, go to the emergency room.  Some things that can make you feel better are: - Increased rest - Increasing fluid with water/sugar free  electrolytes - Acetaminophen  and ibuprofen  as needed for fever/pain - Salt water gargling, chloraseptic spray and throat lozenges for sore throat - Flonase nasal spray for post nasal drainage - OTC guaifenesin (Mucinex) 600 mg twice daily for congestion - Saline sinus flushes or a neti pot - Humidifying the air     ED Prescriptions   None    PDMP not reviewed this encounter.   Chandra Harlene LABOR, NP 07/19/23 [phone]

## 2024-04-23 ENCOUNTER — Other Ambulatory Visit: Payer: Self-pay | Admitting: Internal Medicine

## 2024-04-23 DIAGNOSIS — Z1231 Encounter for screening mammogram for malignant neoplasm of breast: Secondary | ICD-10-CM

## 2024-05-02 ENCOUNTER — Other Ambulatory Visit: Payer: Self-pay

## 2024-05-02 MED ORDER — PREVNAR 20 0.5 ML IM SUSY
0.5000 mL | PREFILLED_SYRINGE | Freq: Once | INTRAMUSCULAR | 0 refills | Status: AC
  Filled 2024-05-02: qty 0.5, 1d supply, fill #0

## 2024-05-27 ENCOUNTER — Ambulatory Visit
Admission: RE | Admit: 2024-05-27 | Discharge: 2024-05-27 | Disposition: A | Source: Ambulatory Visit | Attending: Internal Medicine | Admitting: Internal Medicine

## 2024-05-27 DIAGNOSIS — Z1231 Encounter for screening mammogram for malignant neoplasm of breast: Secondary | ICD-10-CM
